# Patient Record
Sex: Female | Born: 1975
Health system: Southern US, Community
[De-identification: ages and names within clinical notes are randomized; demographics above are authoritative.]

## PROBLEM LIST (undated history)

## (undated) ENCOUNTER — Inpatient Hospital Stay (HOSPITAL_COMMUNITY): Payer: Self-pay

## (undated) DIAGNOSIS — I493 Ventricular premature depolarization: Secondary | ICD-10-CM

## (undated) DIAGNOSIS — N200 Calculus of kidney: Secondary | ICD-10-CM

## (undated) DIAGNOSIS — Z9889 Other specified postprocedural states: Secondary | ICD-10-CM

## (undated) DIAGNOSIS — IMO0002 Reserved for concepts with insufficient information to code with codable children: Secondary | ICD-10-CM

## (undated) DIAGNOSIS — K279 Peptic ulcer, site unspecified, unspecified as acute or chronic, without hemorrhage or perforation: Secondary | ICD-10-CM

## (undated) DIAGNOSIS — R011 Cardiac murmur, unspecified: Secondary | ICD-10-CM

## (undated) DIAGNOSIS — Z872 Personal history of diseases of the skin and subcutaneous tissue: Secondary | ICD-10-CM

## (undated) DIAGNOSIS — R87619 Unspecified abnormal cytological findings in specimens from cervix uteri: Secondary | ICD-10-CM

## (undated) DIAGNOSIS — I7300 Raynaud's syndrome without gangrene: Secondary | ICD-10-CM

## (undated) DIAGNOSIS — T7840XA Allergy, unspecified, initial encounter: Secondary | ICD-10-CM

## (undated) HISTORY — PX: WISDOM TOOTH EXTRACTION: SHX21

## (undated) HISTORY — DX: Calculus of kidney: N20.0

## (undated) HISTORY — DX: Reserved for concepts with insufficient information to code with codable children: IMO0002

## (undated) HISTORY — DX: Allergy, unspecified, initial encounter: T78.40XA

## (undated) HISTORY — DX: Personal history of diseases of the skin and subcutaneous tissue: Z87.2

## (undated) HISTORY — DX: Ventricular premature depolarization: I49.3

## (undated) HISTORY — DX: Peptic ulcer, site unspecified, unspecified as acute or chronic, without hemorrhage or perforation: K27.9

## (undated) HISTORY — DX: Other specified postprocedural states: Z98.890

## (undated) HISTORY — DX: Unspecified abnormal cytological findings in specimens from cervix uteri: R87.619

## (undated) HISTORY — PX: GYNECOLOGIC CRYOSURGERY: SHX857

## (undated) HISTORY — PX: APPENDECTOMY: SHX54

## (undated) HISTORY — DX: Cardiac murmur, unspecified: R01.1

---

## 1998-09-05 ENCOUNTER — Emergency Department (HOSPITAL_COMMUNITY): Admission: EM | Admit: 1998-09-05 | Discharge: 1998-09-05 | Payer: Self-pay | Admitting: Emergency Medicine

## 1998-09-06 ENCOUNTER — Encounter: Payer: Self-pay | Admitting: Emergency Medicine

## 2001-03-23 ENCOUNTER — Encounter: Payer: Self-pay | Admitting: Specialist

## 2001-03-23 ENCOUNTER — Ambulatory Visit (HOSPITAL_COMMUNITY): Admission: RE | Admit: 2001-03-23 | Discharge: 2001-03-23 | Payer: Self-pay | Admitting: Specialist

## 2004-08-31 ENCOUNTER — Ambulatory Visit (HOSPITAL_BASED_OUTPATIENT_CLINIC_OR_DEPARTMENT_OTHER): Admission: RE | Admit: 2004-08-31 | Discharge: 2004-08-31 | Payer: Self-pay | Admitting: Family Medicine

## 2004-08-31 ENCOUNTER — Ambulatory Visit: Payer: Self-pay | Admitting: Internal Medicine

## 2011-01-15 LAB — ANTIBODY SCREEN: Antibody Screen: NEGATIVE

## 2011-01-15 LAB — RPR: RPR: NONREACTIVE

## 2011-01-15 LAB — HIV ANTIBODY (ROUTINE TESTING W REFLEX): HIV: NONREACTIVE

## 2011-01-15 LAB — HEPATITIS B SURFACE ANTIGEN: Hepatitis B Surface Ag: NEGATIVE

## 2011-01-15 LAB — ABO/RH: RH Type: POSITIVE

## 2011-01-15 LAB — RUBELLA ANTIBODY, IGM: Rubella: IMMUNE

## 2011-01-15 LAB — GC/CHLAMYDIA PROBE AMP, GENITAL
Chlamydia: NEGATIVE
Gonorrhea: NEGATIVE

## 2011-02-11 ENCOUNTER — Other Ambulatory Visit (HOSPITAL_COMMUNITY): Payer: Self-pay | Admitting: Obstetrics and Gynecology

## 2011-02-12 ENCOUNTER — Other Ambulatory Visit (HOSPITAL_COMMUNITY): Payer: Self-pay | Admitting: Obstetrics and Gynecology

## 2011-02-12 DIAGNOSIS — O280 Abnormal hematological finding on antenatal screening of mother: Secondary | ICD-10-CM

## 2011-03-04 ENCOUNTER — Ambulatory Visit (HOSPITAL_COMMUNITY)
Admission: RE | Admit: 2011-03-04 | Discharge: 2011-03-04 | Disposition: A | Discharge status: Home / Self Care | Payer: 59 | Source: Ambulatory Visit | Attending: Obstetrics and Gynecology | Admitting: Obstetrics and Gynecology

## 2011-03-04 ENCOUNTER — Encounter (HOSPITAL_COMMUNITY): Payer: Self-pay

## 2011-03-04 VITALS — BP 111/59 | HR 74 | Wt 145.0 lb

## 2011-03-04 DIAGNOSIS — O358XX0 Maternal care for other (suspected) fetal abnormality and damage, not applicable or unspecified: Secondary | ICD-10-CM | POA: Insufficient documentation

## 2011-03-04 DIAGNOSIS — O280 Abnormal hematological finding on antenatal screening of mother: Secondary | ICD-10-CM

## 2011-03-04 DIAGNOSIS — Z1389 Encounter for screening for other disorder: Secondary | ICD-10-CM | POA: Insufficient documentation

## 2011-03-04 DIAGNOSIS — O09519 Supervision of elderly primigravida, unspecified trimester: Secondary | ICD-10-CM | POA: Insufficient documentation

## 2011-03-04 DIAGNOSIS — Z363 Encounter for antenatal screening for malformations: Secondary | ICD-10-CM | POA: Insufficient documentation

## 2011-03-04 NOTE — Progress Notes (Signed)
Vital signs reviewed; see ultrasound report 

## 2011-03-06 DIAGNOSIS — O09519 Supervision of elderly primigravida, unspecified trimester: Secondary | ICD-10-CM | POA: Insufficient documentation

## 2011-03-06 NOTE — Progress Notes (Signed)
Genetic Counseling  High-Risk Gestation Note  Appointment Date:  03/06/2011 Referred By: Cheryl Pila, MD Date of Birth:  Dec 06, 1975 Partner:  Cheryl Buck    Pregnancy History: G1P0 Estimated Date of Delivery: 08/05/11 Estimated Gestational Age: [redacted]w[redacted]d  I met with Cheryl Buck, and her partner, Cheryl Buck, for genetic counseling regarding a maternal age of 52 and an increased risk for Down syndrome based on First trimester screening through [facility].  They were counseled regarding maternal age and the association with risk for chromosome conditions due to nondisjunction with aging of the ova.   We reviewed chromosomes, nondisjunction, and the associated 1 in 125 risk for fetal aneuploidy related to a maternal age of 20 at [redacted] weeks gestation.  They were counseled that the risk for aneuploidy decreases as gestational age increases, accounting for those pregnancies which spontaneously abort.  We specifically discussed Down syndrome (trisomy 5), trisomies 42 and 31, and sex chromosome aneuploidies (47,XXX and 47,XXY) including the common features and prognoses of each.   We also reviewed Cheryl Buck's First screen result and the associated increase in risk for fetal Down syndrome (1 in 52).  They were counseled regarding other explanations for a screen positive result including normal variation.  In addition, we reviewed the screen adjusted reduction in risk for trisomy 70.  They understand that First trimester screening provides a pregnancy specific risk for Down syndrome, but is not considered to be diagnostic.  They were counseled regarding other available screening and diagnostic options including ultrasound and amniocentesis.  The risks, benefits, and limitations of each of these options were reviewed in detail.  After thoughtful consideration of these options, they elected to proceed with ultrasound, but declined amniocentesis.  A complete detailed ultrasound was performed today.  The  ultrasound report will be sent under separate cover.  They understand that screening tests cannot rule out all birth defects or genetic syndromes.  The patient was advised of this limitation and states she still does not want diagnostic testing at this time.  However, they were counseled that 50-80% of fetuses with Down syndrome and up to 90% of fetuses with trisomies 13 and 18, when well visualized, have detectable anomalies or soft markers by ultrasound.   Cheryl Buck was provided with written information regarding cystic fibrosis (CF) including the carrier frequency and incidence in the Caucasian population, the availability of carrier testing and prenatal diagnosis if indicated.  In addition, we discussed that CF is routinely screened for as part of the [place] newborn screening panel.  She declined testing today.   Both family histories were reviewed and found to be contributory. Cheryl Buck reported that her paternal half-brother has Down syndrome.  We discussed that 95% of cases of Down syndrome are not inherited and are the result of non-disjunction.  Three to 4% of cases of Down syndrome are the result of a translocation involving chromosome #21.  We discussed the option of chromosome analysis to determine if an individual is a carrier of a balanced translocation involving chromosome #21.  If an individual carries a balanced translocation involving chromosome #21, then the chance to have a baby with Down syndrome would be greater than the maternal age-related risk.  Cheryl Buck was offered the option of peripheral blood chromosome analysis and declined.  In addition, Cheryl Buck reported that her nephew has Asperger syndrome.  We discussed that this condition is considered to represent the high end of functioning on the autism spectrum.  We discussed that autism  spectrum disorders are typically multifactorial in etiology, caused by a combination of both genes and environmental factors. However, autism can occur as a  feature of an underlying genetic condition such as fragile X syndrome.  If the relative has idiopathic autism, the risk of recurrence is estimated to be close to that of the general population.  If this relative's autism is syndromic, the risk of recurrence depends upon the inheritance of the condition.  Cheryl Buck agreed to contact us if she learns more about her relative's diagnosis.  Without further information regarding the provided family history, an accurate genetic risk cannot be calculated.   Further genetic counseling is warranted if more information is obtained.  Without further information regarding the provided family history, an accurate genetic risk cannot be calculated. Further genetic counseling is warranted if more information is obtained.  Cheryl Buck denied exposure to environmental toxins or chemical agents. She denied the use of tobacco and street drugs. She reported that she had ~6-8 alcoholic beverages during the first four weeks post LMP.  We reviewed the all or none period and that this exposure is not expected to increase the risk for congenital anomalies.  She denied significant viral illnesses during the course of her pregnancy. Her medical and surgical histories were noncontributory.     I counseled this couple regarding the above risks and available options.  The approximate face-to-face time with the genetic counselor was 35 minutes.    Despina Arias Certified Genetic Counselor 03/06/2011

## 2011-07-21 NOTE — L&D Delivery Note (Signed)
Delivery Note Pt pushed well about 1 hour and at 7:20 PM a healthy female was delivered via Vaginal, Spontaneous Delivery (Presentation: Left Occiput Anterior).  APGAR: 8, 9; weight 8 lb 9.7 oz (3904 g).   Placenta status: Intact, Spontaneous.  Cord: 3 vessels with the following complications: None.  Anesthesia: Epidural  Episiotomy: None Lacerations: 2nd degree Suture Repair: 3.0 vicryl rapide Est. Blood Loss (mL): 400  Mom to postpartum.  Baby to nursery-stable.  Oliver Pila 08/10/2011, 7:46 PM

## 2011-08-04 ENCOUNTER — Encounter (HOSPITAL_COMMUNITY): Payer: Self-pay | Admitting: *Deleted

## 2011-08-04 ENCOUNTER — Telehealth (HOSPITAL_COMMUNITY): Payer: Self-pay | Admitting: *Deleted

## 2011-08-04 NOTE — Telephone Encounter (Signed)
Preadmission screen  

## 2011-08-06 ENCOUNTER — Telehealth (HOSPITAL_COMMUNITY): Payer: Self-pay | Admitting: *Deleted

## 2011-08-06 NOTE — Telephone Encounter (Signed)
Preadmission screen  

## 2011-08-07 ENCOUNTER — Telehealth (HOSPITAL_COMMUNITY): Payer: Self-pay | Admitting: *Deleted

## 2011-08-07 ENCOUNTER — Encounter (HOSPITAL_COMMUNITY): Payer: Self-pay | Admitting: *Deleted

## 2011-08-07 NOTE — Telephone Encounter (Signed)
Preadmission screen  

## 2011-08-08 ENCOUNTER — Inpatient Hospital Stay (HOSPITAL_COMMUNITY): Payer: 59

## 2011-08-08 ENCOUNTER — Encounter (HOSPITAL_COMMUNITY): Payer: Self-pay

## 2011-08-08 ENCOUNTER — Inpatient Hospital Stay (HOSPITAL_COMMUNITY)
Admission: AD | Admit: 2011-08-08 | Discharge: 2011-08-08 | Disposition: A | Discharge status: Home / Self Care | Payer: 59 | Source: Ambulatory Visit | Attending: Obstetrics and Gynecology | Admitting: Obstetrics and Gynecology

## 2011-08-08 DIAGNOSIS — N1330 Unspecified hydronephrosis: Secondary | ICD-10-CM | POA: Insufficient documentation

## 2011-08-08 DIAGNOSIS — R109 Unspecified abdominal pain: Secondary | ICD-10-CM | POA: Insufficient documentation

## 2011-08-08 LAB — URINALYSIS, ROUTINE W REFLEX MICROSCOPIC
Bilirubin Urine: NEGATIVE
Bilirubin Urine: NEGATIVE
Glucose, UA: NEGATIVE mg/dL
Ketones, ur: NEGATIVE mg/dL
Nitrite: NEGATIVE
Protein, ur: 100 mg/dL — AB
Urobilinogen, UA: 0.2 mg/dL (ref 0.0–1.0)
pH: 6 (ref 5.0–8.0)

## 2011-08-08 LAB — URINE MICROSCOPIC-ADD ON

## 2011-08-08 LAB — URINE CULTURE
Colony Count: >=100000
Culture  Setup Time: 201301191357

## 2011-08-08 MED ORDER — LACTATED RINGERS IV SOLN
INTRAVENOUS | Status: DC
  Administered 2011-08-08: 10:00:00 via INTRAVENOUS

## 2011-08-08 MED ORDER — OXYCODONE-ACETAMINOPHEN 5-325 MG PO TABS: 1.0000 | ORAL_TABLET | ORAL | Status: AC | PRN

## 2011-08-08 MED ORDER — BUTORPHANOL TARTRATE 2 MG/ML IJ SOLN
1.0000 mg | Freq: Once | INTRAMUSCULAR | Status: AC
  Administered 2011-08-08: 2 mg via INTRAMUSCULAR
  Filled 2011-08-08: qty 1

## 2011-08-08 MED ORDER — PHENAZOPYRIDINE HCL 100 MG PO TABS
200.0000 mg | ORAL_TABLET | Freq: Once | ORAL | Status: AC
  Administered 2011-08-08: 200 mg via ORAL
  Filled 2011-08-08: qty 2

## 2011-08-08 MED ORDER — DEXTROSE 5 % IV SOLN
1.0000 g | INTRAVENOUS | Status: DC
  Administered 2011-08-08: 1 g via INTRAVENOUS
  Filled 2011-08-08: qty 10

## 2011-08-08 MED ORDER — ONDANSETRON 4 MG PO TBDP
4.0000 mg | ORAL_TABLET | Freq: Once | ORAL | Status: DC
  Filled 2011-08-08: qty 1

## 2011-08-08 MED ORDER — HYDROMORPHONE HCL PF 1 MG/ML IJ SOLN
1.0000 mg | Freq: Once | INTRAMUSCULAR | Status: AC
  Administered 2011-08-08: 1 mg via INTRAVENOUS
  Filled 2011-08-08: qty 1

## 2011-08-08 MED ORDER — CEPHALEXIN 500 MG PO CAPS: 500.0000 mg | ORAL_CAPSULE | Freq: Four times a day (QID) | ORAL | Status: AC

## 2011-08-08 NOTE — ED Provider Notes (Signed)
History     Chief Complaint  Patient presents with  . Flank Pain  . Abdominal Pain   Flank Pain This is a new problem. The current episode started today. The problem occurs constantly. The problem has been gradually worsening since onset. Pain location: Right groin. The quality of the pain is described as aching. The pain does not radiate. The pain is at a severity of 10/10. The pain is severe. The pain is the same all the time. The symptoms are aggravated by lying down and twisting. Associated symptoms include abdominal pain.  Abdominal Pain    Pertinent Gynecological History:  Bleeding: {uterine bleeding:321  Past Medical History  Diagnosis Date  . Abnormal Pap smear     Past Surgical History  Procedure Date  . Gynecologic cryosurgery   . Appendectomy     Family History  Problem Relation Age of Onset  . Hypertension Father   . Asthma Father   . Diabetes Brother     type1  . Mental retardation Brother     half brother down's  . Diabetes Maternal Aunt     type2  . Diabetes Maternal Grandfather     type2  . Cancer Paternal Grandmother     colon and pituitary tumor  . Asthma Sister     History  Substance Use Topics  . Smoking status: Never Smoker   . Smokeless tobacco: Never Used  . Alcohol Use: No    Allergies:  Allergies  Allergen Reactions  . Benadryl (Diphenhydramine Hcl)     Races heart has passed out from it  . Coconut Fatty Acids Swelling  . Pineapple Swelling    Prescriptions prior to admission  Medication Sig Dispense Refill  . Prenatal Vit-Fe Fumarate-FA (PRENATAL MULTIVITAMIN) TABS Take 1 tablet by mouth daily.      . ranitidine (ZANTAC) 150 MG tablet Take 150 mg by mouth 2 (two) times daily.        Review of Systems  Gastrointestinal: Positive for abdominal pain.  Genitourinary: Positive for flank pain.   Physical Exam   Blood pressure 125/65, pulse 65, temperature 98.3 F (36.8 C), temperature source Oral, resp. rate 18, SpO2  98.00%.  Physical Exam Pt is in distress from right groin pain There is right flank tenderness The abdomen is soft and not tender The cervix is closed and 60 % effaced The vertex is at - 3 station  MAU Course  Procedures Cath urine TNTC WBC'S 11-20- WBC's Many bacteria. [place] moderate hydronephrosis on the right none on the left Ureteral jets on the left none on the right.  MDM   Assessment and Plan  I spoke to Dr. Laverle Patter and he advised no more studies She should be treated with antibiotics and pain meds and be watched for fever.If she develops a fever she should return for further evaluation. Keflex 500 mg q 6h  Percocet 5/325 # 10 1 po q 4h prn pain Pt received 1 gram of rocephin in the MAU Urine has been sent for culture. She has been given 1 mg stadok and 1 mg dilaudid in the MAU  Cheryl Buck F 08/08/2011, 9:49 AM

## 2011-08-08 NOTE — Progress Notes (Signed)
Patient is here with c/o sudden onset of right flank pain and increase frequency in urination. She denies any vaginal bleeding, lof or discharge. She reports good fetal movement.

## 2011-08-08 NOTE — Progress Notes (Signed)
Dr Ambrose Mantle notified of patient and her complaints including ua results. Order to give patient po water and obtain cath UA and call with results.

## 2011-08-08 NOTE — ED Notes (Signed)
Patient given strainer to strain urine at home.

## 2011-08-09 ENCOUNTER — Inpatient Hospital Stay (HOSPITAL_COMMUNITY)
Admission: RE | Admit: 2011-08-09 | Discharge: 2011-08-12 | DRG: 775 | Disposition: A | Discharge status: Home / Self Care | Payer: 59 | Source: Ambulatory Visit | Attending: Obstetrics and Gynecology | Admitting: Obstetrics and Gynecology

## 2011-08-09 ENCOUNTER — Other Ambulatory Visit: Payer: Self-pay | Admitting: Obstetrics and Gynecology

## 2011-08-09 ENCOUNTER — Encounter (HOSPITAL_COMMUNITY): Payer: Self-pay | Admitting: Pharmacist

## 2011-08-09 ENCOUNTER — Encounter (HOSPITAL_COMMUNITY): Payer: Self-pay

## 2011-08-09 DIAGNOSIS — O480 Post-term pregnancy: Principal | ICD-10-CM | POA: Diagnosis present

## 2011-08-09 DIAGNOSIS — O09519 Supervision of elderly primigravida, unspecified trimester: Secondary | ICD-10-CM | POA: Diagnosis present

## 2011-08-09 HISTORY — DX: Raynaud's syndrome without gangrene: I73.00

## 2011-08-09 LAB — CBC
HCT: 40.9 % (ref 36.0–46.0)
Hemoglobin: 13.7 g/dL (ref 12.0–15.0)
WBC: 14.3 10*3/uL — ABNORMAL HIGH (ref 4.0–10.5)

## 2011-08-09 MED ORDER — ONDANSETRON HCL 4 MG/2ML IJ SOLN
4.0000 mg | Freq: Four times a day (QID) | INTRAMUSCULAR | Status: DC | PRN
  Administered 2011-08-10: 4 mg via INTRAVENOUS
  Filled 2011-08-09: qty 2

## 2011-08-09 MED ORDER — LACTATED RINGERS IV SOLN
INTRAVENOUS | Status: DC
  Administered 2011-08-09 – 2011-08-10 (×3): via INTRAVENOUS

## 2011-08-09 MED ORDER — FLEET ENEMA 7-19 GM/118ML RE ENEM: 1.0000 | ENEMA | RECTAL | Status: DC | PRN

## 2011-08-09 MED ORDER — OXYTOCIN BOLUS FROM INFUSION
500.0000 mL | Freq: Once | INTRAVENOUS | Status: AC
  Administered 2011-08-10: 500 mL via INTRAVENOUS
  Filled 2011-08-09: qty 500

## 2011-08-09 MED ORDER — LACTATED RINGERS IV SOLN
500.0000 mL | INTRAVENOUS | Status: DC | PRN
  Administered 2011-08-10: 500 mL via INTRAVENOUS

## 2011-08-09 MED ORDER — CEPHALEXIN 500 MG PO CAPS
500.0000 mg | ORAL_CAPSULE | Freq: Three times a day (TID) | ORAL | Status: DC
  Administered 2011-08-09 – 2011-08-10 (×3): 500 mg via ORAL
  Filled 2011-08-09 (×6): qty 1

## 2011-08-09 MED ORDER — OXYTOCIN 20 UNITS IN LACTATED RINGERS INFUSION - SIMPLE: 125.0000 mL/h | Freq: Once | INTRAVENOUS | Status: DC

## 2011-08-09 MED ORDER — CITRIC ACID-SODIUM CITRATE 334-500 MG/5ML PO SOLN: 30.0000 mL | ORAL | Status: DC | PRN

## 2011-08-09 MED ORDER — ZOLPIDEM TARTRATE 10 MG PO TABS: 10.0000 mg | ORAL_TABLET | Freq: Every evening | ORAL | Status: DC | PRN

## 2011-08-09 MED ORDER — LIDOCAINE HCL (PF) 1 % IJ SOLN
30.0000 mL | INTRAMUSCULAR | Status: DC | PRN
  Filled 2011-08-09: qty 30

## 2011-08-09 MED ORDER — ACETAMINOPHEN 325 MG PO TABS: 650.0000 mg | ORAL_TABLET | ORAL | Status: DC | PRN

## 2011-08-09 MED ORDER — IBUPROFEN 600 MG PO TABS
600.0000 mg | ORAL_TABLET | Freq: Four times a day (QID) | ORAL | Status: DC | PRN
  Administered 2011-08-10: 600 mg via ORAL
  Filled 2011-08-09: qty 1

## 2011-08-09 MED ORDER — TERBUTALINE SULFATE 1 MG/ML IJ SOLN: 0.2500 mg | Freq: Once | INTRAMUSCULAR | Status: AC | PRN

## 2011-08-09 MED ORDER — BUTORPHANOL TARTRATE 2 MG/ML IJ SOLN
1.0000 mg | INTRAMUSCULAR | Status: DC | PRN
  Administered 2011-08-10 (×2): 1 mg via INTRAVENOUS
  Filled 2011-08-09 (×2): qty 1

## 2011-08-09 MED ORDER — OXYTOCIN 20 UNITS IN LACTATED RINGERS INFUSION - SIMPLE
1.0000 m[IU]/min | INTRAVENOUS | Status: DC
  Administered 2011-08-10: 2 m[IU]/min via INTRAVENOUS
  Filled 2011-08-09: qty 1000

## 2011-08-09 MED ORDER — OXYCODONE-ACETAMINOPHEN 5-325 MG PO TABS: 2.0000 | ORAL_TABLET | ORAL | Status: DC | PRN

## 2011-08-09 MED ORDER — DINOPROSTONE 10 MG VA INST
10.0000 mg | VAGINAL_INSERT | Freq: Once | VAGINAL | Status: AC
  Administered 2011-08-09: 10 mg via VAGINAL
  Filled 2011-08-09: qty 1

## 2011-08-09 NOTE — H&P (Signed)
Cheryl Buck is a 36 y.o. female G1P0 at 82 4/7 weeks (EDD 08/05/11 by LMP C/W 10 weeks [place]) presenting for induction.  Prenatal care un eventful except an elevated DSR of 1:53 by first trimester screen.  Pt declined amnio, but did have a normal level 2 [place].  No other significant issues.  History OB History    Grav Para Term Preterm Abortions TAB SAB Ect Mult Living   1              Past Medical History  Diagnosis Date  . Abnormal Pap smear   . Raynaud's disease    Past Surgical History  Procedure Date  . Gynecologic cryosurgery   . Appendectomy    Family History: family history includes Asthma in her father and sister; Cancer in her paternal grandmother; Diabetes in her brother, maternal aunt, and maternal grandfather; Hypertension in her father; and Mental retardation in her brother. Social History:  reports that she has never smoked. She has never used smokeless tobacco. She reports that she does not drink alcohol or use illicit drugs.  ROS  Dilation: Closed Effacement (%): Thick Station: -3 Exam by:: L.mears,RN Temperature 97.7 F (36.5 C), temperature source Oral, resp. rate 16, height 5\' 8"  (1.727 m), weight 80.74 kg (178 lb). Maternal Exam:  Uterine Assessment: Contraction strength is mild.  Contraction frequency is irregular.   Abdomen: Patient reports no abdominal tenderness. Fetal presentation: vertex  Introitus: Normal vulva. Normal vagina.    Fetal Exam Fetal State Assessment: Category I - tracings are normal.     Physical Exam  Constitutional: She is oriented to person, place, and time. She appears well-developed and well-nourished.  Cardiovascular: Normal rate and regular rhythm.   Respiratory: Effort normal and breath sounds normal.  GI: Soft. Bowel sounds are normal.  Genitourinary: Vagina normal and uterus normal.  Musculoskeletal: Normal range of motion.  Neurological: She is alert and oriented to person, place, and time.  Psychiatric: She has a  normal mood and affect.    Prenatal labs: ABO, Rh: O/Positive/-- (06/28 0000) Antibody: Negative (06/28 0000) Rubella: Immune (06/28 0000) RPR: Nonreactive (06/28 0000)  HBsAg: Negative (06/28 0000)  HIV: Non-reactive (06/28 0000)  GBS: Negative (12/17 0000) Increased DSR 1:52 on first trimester screen AFP WNL One hour GTT 94   Assessment/Plan: Pt for cervical ripening and induction given postdates. Cervix feels scarred, may need foley bulb to open.  Will assess s/p cervidil.   Oliver Pila 08/09/2011, 10:27 PM

## 2011-08-09 NOTE — Progress Notes (Signed)
Dr. Ambrose Mantle notified of patient status, FHR tracing and uc pattern. To continue POC.

## 2011-08-10 ENCOUNTER — Encounter (HOSPITAL_COMMUNITY): Payer: Self-pay

## 2011-08-10 ENCOUNTER — Inpatient Hospital Stay (HOSPITAL_COMMUNITY): Payer: 59 | Admitting: Anesthesiology

## 2011-08-10 ENCOUNTER — Encounter (HOSPITAL_COMMUNITY): Payer: Self-pay | Admitting: Anesthesiology

## 2011-08-10 LAB — RPR: RPR Ser Ql: NONREACTIVE

## 2011-08-10 MED ORDER — BENZOCAINE-MENTHOL 20-0.5 % EX AERO
1.0000 "application " | INHALATION_SPRAY | CUTANEOUS | Status: DC | PRN
  Administered 2011-08-11 (×2): 1 via TOPICAL

## 2011-08-10 MED ORDER — ZOLPIDEM TARTRATE 5 MG PO TABS: 5.0000 mg | ORAL_TABLET | Freq: Every evening | ORAL | Status: DC | PRN

## 2011-08-10 MED ORDER — EPHEDRINE 5 MG/ML INJ: 10.0000 mg | INTRAVENOUS | Status: DC | PRN

## 2011-08-10 MED ORDER — TETANUS-DIPHTH-ACELL PERTUSSIS 5-2.5-18.5 LF-MCG/0.5 IM SUSP
0.5000 mL | Freq: Once | INTRAMUSCULAR | Status: DC
  Filled 2011-08-10: qty 0.5

## 2011-08-10 MED ORDER — ONDANSETRON HCL 4 MG PO TABS: 4.0000 mg | ORAL_TABLET | ORAL | Status: DC | PRN

## 2011-08-10 MED ORDER — SENNOSIDES-DOCUSATE SODIUM 8.6-50 MG PO TABS
2.0000 | ORAL_TABLET | Freq: Every day | ORAL | Status: DC
  Administered 2011-08-10 – 2011-08-11 (×2): 2 via ORAL

## 2011-08-10 MED ORDER — PHENYLEPHRINE 40 MCG/ML (10ML) SYRINGE FOR IV PUSH (FOR BLOOD PRESSURE SUPPORT): 80.0000 ug | PREFILLED_SYRINGE | INTRAVENOUS | Status: DC | PRN

## 2011-08-10 MED ORDER — ONDANSETRON HCL 4 MG/2ML IJ SOLN: 4.0000 mg | INTRAMUSCULAR | Status: DC | PRN

## 2011-08-10 MED ORDER — FENTANYL 2.5 MCG/ML BUPIVACAINE 1/10 % EPIDURAL INFUSION (WH - ANES)
14.0000 mL/h | INTRAMUSCULAR | Status: DC
  Administered 2011-08-10 (×2): 14 mL/h via EPIDURAL
  Filled 2011-08-10 (×3): qty 60

## 2011-08-10 MED ORDER — PHENYLEPHRINE 40 MCG/ML (10ML) SYRINGE FOR IV PUSH (FOR BLOOD PRESSURE SUPPORT)
80.0000 ug | PREFILLED_SYRINGE | INTRAVENOUS | Status: DC | PRN
  Filled 2011-08-10: qty 5

## 2011-08-10 MED ORDER — SODIUM BICARBONATE 8.4 % IV SOLN
INTRAVENOUS | Status: DC | PRN
  Administered 2011-08-10: 4 mL via EPIDURAL

## 2011-08-10 MED ORDER — DIBUCAINE 1 % RE OINT: 1.0000 "application " | TOPICAL_OINTMENT | RECTAL | Status: DC | PRN

## 2011-08-10 MED ORDER — IBUPROFEN 600 MG PO TABS
600.0000 mg | ORAL_TABLET | Freq: Four times a day (QID) | ORAL | Status: DC
  Administered 2011-08-11 – 2011-08-12 (×6): 600 mg via ORAL
  Filled 2011-08-10 (×6): qty 1

## 2011-08-10 MED ORDER — LACTATED RINGERS IV SOLN: 500.0000 mL | Freq: Once | INTRAVENOUS | Status: DC

## 2011-08-10 MED ORDER — OXYCODONE-ACETAMINOPHEN 5-325 MG PO TABS: 1.0000 | ORAL_TABLET | ORAL | Status: DC | PRN

## 2011-08-10 MED ORDER — EPHEDRINE 5 MG/ML INJ
10.0000 mg | INTRAVENOUS | Status: DC | PRN
  Filled 2011-08-10: qty 4

## 2011-08-10 MED ORDER — PRENATAL MULTIVITAMIN CH
1.0000 | ORAL_TABLET | Freq: Every day | ORAL | Status: DC
  Administered 2011-08-11 – 2011-08-12 (×2): 1 via ORAL
  Filled 2011-08-10 (×2): qty 1

## 2011-08-10 MED ORDER — FENTANYL 2.5 MCG/ML BUPIVACAINE 1/10 % EPIDURAL INFUSION (WH - ANES)
INTRAMUSCULAR | Status: DC | PRN
  Administered 2011-08-10: 14 mL/h via EPIDURAL

## 2011-08-10 MED ORDER — CEPHALEXIN 500 MG PO CAPS
500.0000 mg | ORAL_CAPSULE | Freq: Four times a day (QID) | ORAL | Status: DC
  Administered 2011-08-10 – 2011-08-12 (×6): 500 mg via ORAL
  Filled 2011-08-10 (×7): qty 1

## 2011-08-10 MED ORDER — DIPHENHYDRAMINE HCL 25 MG PO CAPS: 25.0000 mg | ORAL_CAPSULE | Freq: Four times a day (QID) | ORAL | Status: DC | PRN

## 2011-08-10 MED ORDER — LANOLIN HYDROUS EX OINT: TOPICAL_OINTMENT | CUTANEOUS | Status: DC | PRN

## 2011-08-10 MED ORDER — SIMETHICONE 80 MG PO CHEW: 80.0000 mg | CHEWABLE_TABLET | ORAL | Status: DC | PRN

## 2011-08-10 MED ORDER — WITCH HAZEL-GLYCERIN EX PADS: 1.0000 "application " | MEDICATED_PAD | CUTANEOUS | Status: DC | PRN

## 2011-08-10 NOTE — Progress Notes (Signed)
   Subjective: Pt comfortable with epidural Objective: BP 113/58  Pulse 71  Temp(Src) 98.3 F (36.8 C) (Oral)  Resp 18  Ht 5\' 8"  (1.727 m)  Wt 80.74 kg (178 lb)  BMI 27.06 kg/m2  SpO2 100%      FHT:  FHR: 150 bpm, variability: moderate,  accelerations:  Present,  decelerations:  Absent UC:   regular, every 2-6 minutes SVE: 4/90/-1  AROM clear IUPC placed  Labs: Lab Results  Component Value Date   WBC 14.3* 08/09/2011   HGB 13.7 08/09/2011   HCT 40.9 08/09/2011   MCV 92.7 08/09/2011   PLT 171 08/09/2011    Assessment / Plan: Augment with pitocin as needed Neenah Canter W 08/10/2011, 9:13 AM

## 2011-08-10 NOTE — Anesthesia Preprocedure Evaluation (Signed)

## 2011-08-10 NOTE — Anesthesia Procedure Notes (Signed)

## 2011-08-10 NOTE — Progress Notes (Signed)
Patient ID: Cheryl Buck, female   DOB: 1976/01/06, 36 y.o.   MRN: [id number] Per the RN as she removed the cervidil the cervix is unchanged and remains undilated.

## 2011-08-10 NOTE — Progress Notes (Signed)
Cheryl Buck is a 36 y.o. G1P0 at [redacted]w[redacted]d by LMP admitted for induction of labor due to Post dates. Due date 08/05/11.  She received cervidil overnight and has been very uncomfortable with contractions for several hours, little relief from stadol.    Subjective: Pt uncomfortable with contractions3  Objective: BP 129/73  Pulse 63  Temp(Src) 98.3 F (36.8 C) (Oral)  Resp 18  Ht 5\' 8"  (1.727 m)  Wt 80.74 kg (178 lb)  BMI 27.06 kg/m2      FHT:  FHR: 130 bpm, variability: moderate,  accelerations:  Present,  decelerations:  Absent UC:   regular, every 2-3 minutes SVE:   Dilation: Closed Effacement (%): 90 Station: -2 Exam by:: Dr Senaida Ores  Labs: Lab Results  Component Value Date   WBC 14.3* 08/09/2011   HGB 13.7 08/09/2011   HCT 40.9 08/09/2011   MCV 92.7 08/09/2011   PLT 171 08/09/2011    Assessment / Plan: Pt is very uncomfortable with contractions.  Cervix still scarred, but feels effaced and will allow epidural so can manually dilate os. Cheryl Buck 08/10/2011, 8:11 AM

## 2011-08-10 NOTE — Progress Notes (Signed)
   Subjective: Comfortable with epidural  Objective: BP 115/66  Pulse 70  Temp(Src) 98.3 F (36.8 C) (Oral)  Resp 20  Ht 5\' 8"  (1.727 m)  Wt 80.74 kg (178 lb)  BMI 27.06 kg/m2  SpO2 99%      FHT:  FHR: 135 bpm, variability: moderate,  accelerations:  Present,  decelerations:  Absent UC:   regular, every 2-3 minutes SVE:   Dilation: 9 Effacement (%): 100 Station: 0 Exam by:: Dr. Senaida Ores  Labs: Lab Results  Component Value Date   WBC 14.3* 08/09/2011   HGB 13.7 08/09/2011   HCT 40.9 08/09/2011   MCV 92.7 08/09/2011   PLT 171 08/09/2011    Assessment / Plan: Making good progress Cheryl Buck W 08/10/2011, 3:17 PM

## 2011-08-10 NOTE — Progress Notes (Signed)
   Subjective: Comfortable with epidural  Objective: BP 113/66  Pulse 72  Temp(Src) 98 F (36.7 C) (Oral)  Resp 18  Ht 5\' 8"  (1.727 m)  Wt 80.74 kg (178 lb)  BMI 27.06 kg/m2  SpO2 99%      FHT:  FHR: 130 bpm, variability: moderate,  accelerations:  Present,  decelerations:  Absent UC:   regular, every 2-3 minutes, not yet quite adequate SVE:   Dilation: 6 Effacement (%): 100 Station: -1 Exam by:: Erline Hau RNC at 100pm  Labs: Lab Results  Component Value Date   WBC 14.3* 08/09/2011   HGB 13.7 08/09/2011   HCT 40.9 08/09/2011   MCV 92.7 08/09/2011   PLT 171 08/09/2011    Assessment / Plan: Making progress.  Cheryl Buck 08/10/2011, 1:39 PM

## 2011-08-10 NOTE — Progress Notes (Signed)
This note also relates to the following rows which could not be included: BP - Cannot attach notes to unvalidated device data Pulse Rate - Cannot attach notes to unvalidated device data Dr. Ambrose Mantle at bedside. Notified of sve, fhr tracing and uc pattern, and patients pain. Cervidil removed and pitocin to be held until Dr. Senaida Ores assesses patient later today and reviews POC.

## 2011-08-11 LAB — CBC
MCH: 31.1 pg (ref 26.0–34.0)
MCHC: 33.6 g/dL (ref 30.0–36.0)
MCV: 92.6 fL (ref 78.0–100.0)
Platelets: 145 10*3/uL — ABNORMAL LOW (ref 150–400)
RDW: 13.9 % (ref 11.5–15.5)

## 2011-08-11 MED ORDER — BENZOCAINE-MENTHOL 20-0.5 % EX AERO
INHALATION_SPRAY | CUTANEOUS | Status: AC
  Administered 2011-08-11: 1 via TOPICAL
  Filled 2011-08-11: qty 56

## 2011-08-11 NOTE — Anesthesia Postprocedure Evaluation (Signed)
  Anesthesia Post-op Note  Patient: Cheryl Buck  Procedure(s) Performed: * No procedures listed *  Patient Location: Mother/Baby  Anesthesia Type: Epidural  Level of Consciousness: alert  and oriented  Airway and Oxygen Therapy: Patient Spontanous Breathing  Post-op Pain: mild  Post-op Assessment: Patient's Cardiovascular Status Stable and Respiratory Function Stable  Post-op Vital Signs: stable  Complications: No apparent anesthesia complications

## 2011-08-11 NOTE — Progress Notes (Signed)
Post Partum Day 1 Subjective: no complaints and tolerating PO  Objective: Blood pressure 120/73, pulse 73, temperature 98.3 F (36.8 C), temperature source Oral, resp. rate 18, height 5\' 8"  (1.727 m), weight 80.74 kg (178 lb), SpO2 96.00%, unknown if currently breastfeeding.  Physical Exam:  General: alert Lochia: appropriate Uterine Fundus: firm    Basename 08/11/11 0520 08/09/11 2000  HGB 11.8* 13.7  HCT 35.1* 40.9    Assessment/Plan: Plan for discharge tomorrow   LOS: 2 days   Cordarro Spinnato W 08/11/2011, 8:28 AM

## 2011-08-12 MED ORDER — IBUPROFEN 600 MG PO TABS: 600.0000 mg | ORAL_TABLET | Freq: Four times a day (QID) | ORAL | Status: AC

## 2011-08-12 NOTE — Discharge Summary (Signed)
Obstetric Discharge Summary Reason for Admission: induction of labor Prenatal Procedures: none Intrapartum Procedures: spontaneous vaginal delivery Postpartum Procedures: none Complications-Operative and Postpartum: second degree perineal laceration Hemoglobin  Date Value Range Status  08/11/2011 11.8* 12.0-15.0 (g/dL) Final     HCT  Date Value Range Status  08/11/2011 35.1* 36.0-46.0 (%) Final    Discharge Diagnoses: Term Pregnancy-delivered  Discharge Information: Date: 08/12/2011 Activity: pelvic rest Diet: routine Medications: Ibuprofen and Percocet Condition: improved Instructions: refer to practice specific booklet Discharge to: home   Newborn Data: Live born female  Birth Weight: 8 lb 9.7 oz (3904 g) APGAR: 8, 9  Home with mother.  Cheryl Buck 08/12/2011, 9:04 AM

## 2011-08-12 NOTE — Progress Notes (Signed)
Post Partum Day 2 Subjective: no complaints, up ad lib and tolerating PO  Objective: Blood pressure 116/67, pulse 70, temperature 97.8 F (36.6 C), temperature source Oral, resp. rate 18, height 5\' 8"  (1.727 m), weight 80.74 kg (178 lb), SpO2 98.00%, unknown if currently breastfeeding.  Physical Exam:  General: alert Lochia: appropriate Uterine Fundus: firm   Basename 08/11/11 0520 08/09/11 2000  HGB 11.8* 13.7  HCT 35.1* 40.9    Assessment/Plan: Discharge home Motrin   LOS: 3 days   Cheryl Buck W 08/12/2011, 9:02 AM

## 2011-08-17 ENCOUNTER — Encounter (HOSPITAL_COMMUNITY): Payer: 59

## 2011-08-17 ENCOUNTER — Ambulatory Visit (HOSPITAL_COMMUNITY)
Admission: RE | Admit: 2011-08-17 | Discharge: 2011-08-17 | Disposition: A | Discharge status: Home / Self Care | Payer: 59 | Source: Ambulatory Visit | Attending: Obstetrics and Gynecology | Admitting: Obstetrics and Gynecology

## 2011-08-17 NOTE — Progress Notes (Signed)
Adult Lactation Consultation Outpatient Visit Note  Patient Name: Cheryl Buck Date of Birth: Feb 22, 1976 Gestational Age at Delivery: [redacted]w[redacted]d Type of Delivery:   Breastfeeding History: Frequency of Breastfeeding:  Length of Feeding:15 mins   Voids: 8-10 Stools: 5-6 yellow   Supplementing / Method: Pumping:  Type of Pump:medela   Frequency: 3-4 times daily  Volume:  30-45 ml  Comments: Mother here to follow up for weight check and feeding assist. Lots of questions about plugged ductal system.   Consultation Evaluation: Assessed mothers breast and observed that left breast did have lots of full ducts. inst mother in good massage and using football hold to massage breast. Assistance with good massage and resolved fullness. Infant fed very well and transferred 60 ml from first breast. 2nd breast offered and infant took 20 ml . Mother receptive to teaching tips and referred her to follow up prn or attend mothers support group as desired for pre and post weights.  Initial Feeding Assessment: Pre-feed Weight:3630 Post-feed [id number] Amount Transferred:18ml Comments:  Additional Feeding Assessment: Pre-feed Weight:3690 Post-feed Weight:3710 Amount Transferred:51ml Comments:  Additional Feeding Assessment: Pre-feed Weight: Post-feed Weight: Amount Transferred: Comments:  Total Breast milk Transferred this Visit: 80ml Total Supplement Given:   Additional Interventions: Mother encouraged to continue to cue base feed infant and to pump several times daily to sustain milk volume.  Mother very encouraged about good weight gain and adequate feeding. Mother plans to follow up as needed.  Follow-Up  Prn    Stevan Born [facility] 08/17/2011, 4:35 PM

## 2012-02-05 ENCOUNTER — Emergency Department (HOSPITAL_COMMUNITY)
Admission: EM | Admit: 2012-02-05 | Discharge: 2012-02-05 | Disposition: A | Discharge status: Home / Self Care | Payer: 59 | Source: Home / Self Care | Attending: Emergency Medicine | Admitting: Emergency Medicine

## 2012-02-05 DIAGNOSIS — J40 Bronchitis, not specified as acute or chronic: Secondary | ICD-10-CM

## 2012-02-05 MED ORDER — GUAIFENESIN-CODEINE 100-10 MG/5ML PO SYRP: 5.0000 mL | ORAL_SOLUTION | Freq: Three times a day (TID) | ORAL | Status: AC | PRN

## 2012-02-05 MED ORDER — AMOXICILLIN 500 MG PO CAPS: 500.0000 mg | ORAL_CAPSULE | Freq: Three times a day (TID) | ORAL | Status: AC

## 2012-02-05 MED ORDER — DOXYCYCLINE HYCLATE 100 MG PO CAPS: 100.0000 mg | ORAL_CAPSULE | Freq: Two times a day (BID) | ORAL | Status: DC

## 2012-02-05 NOTE — ED Provider Notes (Addendum)
History     CSN: [id number]  Arrival date & time 02/05/12  1237   First MD Initiated Contact with Patient 02/05/12 1323      No chief complaint on file.   (Consider location/radiation/quality/duration/timing/severity/associated sxs/prior treatment) HPI Comments: Patient has been coughing since Thursday with fatigue and tiredness lack of appetite low energy had initial tactile fevers for the first 2 days that has subsided for cough seemed to have prolonged until today and not letting her sleep occasionally dry and with some productive sputum. She feels tired and fatigue and poor appetite. She has been taken over-the-counter medicines as cough suppressants. Patient denies any shortness of breath at rest or exertional, no chest pain, describes a sore throat it has increased in the last 2-3 days. Denies any ear pain, headache.   The history is provided by the patient.    Past Medical History  Diagnosis Date  . Abnormal Pap smear   . Raynaud's disease     Past Surgical History  Procedure Date  . Gynecologic cryosurgery   . Appendectomy     Family History  Problem Relation Age of Onset  . Hypertension Father   . Asthma Father   . Diabetes Brother     type1  . Mental retardation Brother     half brother down's  . Diabetes Maternal Aunt     type2  . Diabetes Maternal Grandfather     type2  . Cancer Paternal Grandmother     colon and pituitary tumor  . Asthma Sister     History  Substance Use Topics  . Smoking status: Never Smoker   . Smokeless tobacco: Never Used  . Alcohol Use: No    OB History    Grav Para Term Preterm Abortions TAB SAB Ect Mult Living   1 1 1       1       Review of Systems  Constitutional: Positive for fever, activity change, appetite change and fatigue.  HENT: Negative for neck pain and neck stiffness.   Eyes: Negative for redness and visual disturbance.  Respiratory: Positive for cough. Negative for chest tightness and wheezing.     Cardiovascular: Negative for chest pain, palpitations and leg swelling.  Gastrointestinal: Negative for nausea and abdominal pain.  Skin: Negative for rash.  Neurological: Negative for weakness and numbness.    Allergies  Benadryl; Coconut fatty acids; and Pineapple  Home Medications   Current Outpatient Rx  Name Route Sig Dispense Refill  . AMOXICILLIN 500 MG PO CAPS Oral Take 1 capsule (500 mg total) by mouth 3 (three) times daily. 30 capsule 0  . GUAIFENESIN-CODEINE 100-10 MG/5ML PO SYRP Oral Take 5 mLs by mouth 3 (three) times daily as needed for cough or congestion. 120 mL 0  . PRENATAL MULTIVITAMIN CH Oral Take 1 tablet by mouth daily.      BP 110/73  Pulse 68  Temp 97.4 F (36.3 C) (Oral)  Resp 18  SpO2 100%  Breastfeeding? Yes  Physical Exam  Nursing note and vitals reviewed. Constitutional: She is oriented to person, place, and time. Vital signs are normal. She appears well-developed and well-nourished.  Non-toxic appearance. She does not have a sickly appearance. She does not appear ill. No distress.  HENT:  Head: Normocephalic.  Right Ear: Tympanic membrane normal.  Mouth/Throat: Uvula is midline, oropharynx is clear and moist and mucous membranes are normal.  Eyes: Conjunctivae are normal. No scleral icterus.  Neck: Neck supple.  Cardiovascular: Normal rate.  Pulmonary/Chest: Effort normal and breath sounds normal. No respiratory distress. She has no decreased breath sounds. She has no wheezes. She has no rhonchi. She has no rales. She exhibits no tenderness.  Abdominal: She exhibits no distension. There is no guarding.  Musculoskeletal: Normal range of motion.  Neurological: She is alert and oriented to person, place, and time.  Skin: No rash noted. No erythema.    ED Course  Procedures (including critical care time)   Labs Reviewed  POCT RAPID STREP A ([facility] ONLY)   No results found.   1. Bronchitis       MDM  Patient has been  symptomatic for 8 days. Now with productive cough somewhat fatigued. Patient can very well have a viral respiratory syndrome but symptoms are not improving and in fact patient feels she's getting worse. Have decided to start patient on antibiotics (amoxicillin) and treat with cough syrup with codeine and guanfacine. She was advised to return in 3 days if no improvement or sooner if any worsening symptoms.        Jimmie Molly, MD 02/05/12 1511  Jimmie Molly, MD 02/05/12 2112

## 2012-03-29 DIAGNOSIS — D229 Melanocytic nevi, unspecified: Secondary | ICD-10-CM

## 2012-03-29 HISTORY — DX: Melanocytic nevi, unspecified: D22.9

## 2012-06-24 ENCOUNTER — Ambulatory Visit (INDEPENDENT_AMBULATORY_CARE_PROVIDER_SITE_OTHER): Payer: Commercial Managed Care - PPO | Admitting: General Surgery

## 2012-06-24 ENCOUNTER — Encounter (INDEPENDENT_AMBULATORY_CARE_PROVIDER_SITE_OTHER): Payer: Self-pay | Admitting: General Surgery

## 2012-06-24 VITALS — BP 126/62 | HR 62 | Temp 97.8°F | Resp 18 | Ht 69.0 in | Wt 135.6 lb

## 2012-06-24 DIAGNOSIS — R2230 Localized swelling, mass and lump, unspecified upper limb: Secondary | ICD-10-CM

## 2012-06-24 DIAGNOSIS — R229 Localized swelling, mass and lump, unspecified: Secondary | ICD-10-CM

## 2012-06-26 NOTE — Progress Notes (Signed)
Patient ID: Cheryl Buck, female   DOB: 07/15/1976, 36 y.o.   MRN: [id number]  Chief Complaint  Patient presents with  . New Evaluation    Lymph    HPI Cheryl Buck is a 36 y.o. female.   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  She had mass show up in lue over a week ago.  She was seen by her pcp and was given a course of antibiotics. This area has gotten somewhat smaller and has significantly over last 24 hours.  She reports no B symptoms.    Past Medical History  Diagnosis Date  . Abnormal Pap smear   . Raynaud's disease     Past Surgical History  Procedure Date  . Gynecologic cryosurgery   . Appendectomy     Family History  Problem Relation Age of Onset  . Hypertension Father   . Asthma Father   . Diabetes Brother     type1  . Mental retardation Brother     half brother down's  . Hypertension Brother   . Diabetes Maternal Aunt     type2  . Diabetes Maternal Grandfather     type2  . Heart disease Maternal Grandfather   . Cancer Paternal Grandmother     colon and pituitary tumor  . Asthma Sister   . Hyperlipidemia Mother   . GER disease Mother   . Heart disease Maternal Grandmother   . Stroke Paternal Grandfather     Social History History  Substance Use Topics  . Smoking status: Never Smoker   . Smokeless tobacco: Never Used  . Alcohol Use: No    Allergies  Allergen Reactions  . Benadryl (Diphenhydramine Hcl)     Races heart has passed out from it  . Coconut Fatty Acids Swelling  . Pineapple Swelling    Current Outpatient Prescriptions  Medication Sig Dispense Refill  . Prenatal Vit-Fe Fumarate-FA (PRENATAL MULTIVITAMIN) TABS Take 1 tablet by mouth daily.        Review of Systems Review of Systems  Constitutional: Negative for fever, chills and unexpected weight change.  HENT: Negative for hearing loss, congestion, sore throat, trouble swallowing and voice change.   Eyes: Negative for visual disturbance.    Respiratory: Negative for cough and wheezing.   Cardiovascular: Negative for chest pain, palpitations and leg swelling.  Gastrointestinal: Negative for nausea, vomiting, abdominal pain, diarrhea, constipation, blood in stool, abdominal distention and anal bleeding.  Genitourinary: Negative for hematuria, vaginal bleeding and difficulty urinating.  Musculoskeletal: Negative for arthralgias.  Skin: Negative for rash and wound.  Neurological: Negative for seizures, syncope and headaches.  Hematological: Negative for adenopathy. Does not bruise/bleed easily.  Psychiatric/Behavioral: Negative for confusion.    Blood pressure 126/62, pulse 62, temperature 97.8 F (36.6 C), temperature source Temporal, resp. rate 18, height 5' 9" (1.753 m), weight 135 lb 9.6 oz (61.508 kg).  Physical Exam Physical Exam  Vitals reviewed. Constitutional: She appears well-developed and well-nourished.  Musculoskeletal:       Arms:     Assessment    LUE mass    Plan    She has no constitutional symptoms.  I examined this yesterday in the or and it is smaller today by both her history and my exam.  I think observing this a little longer is warranted prior to excising. We could also get an ultrasound as we discussed today.  She will let me know early next week how   this is doing and we will decide.       Tonjia Parillo 06/26/2012, 9:22 AM    

## 2012-06-27 ENCOUNTER — Other Ambulatory Visit (INDEPENDENT_AMBULATORY_CARE_PROVIDER_SITE_OTHER): Payer: Self-pay | Admitting: General Surgery

## 2012-06-27 ENCOUNTER — Telehealth (INDEPENDENT_AMBULATORY_CARE_PROVIDER_SITE_OTHER): Payer: Self-pay | Admitting: General Surgery

## 2012-06-27 DIAGNOSIS — R2230 Localized swelling, mass and lump, unspecified upper limb: Secondary | ICD-10-CM

## 2012-06-27 NOTE — Telephone Encounter (Signed)
Patient scheduled 06/28/12 @ 12:45 pm for ultrasound right arm mass at [facility]. [facility] for patient to call back to make her aware. Patient called back and she was made aware of appt.

## 2012-06-28 ENCOUNTER — Ambulatory Visit (HOSPITAL_COMMUNITY)
Admission: RE | Admit: 2012-06-28 | Discharge: 2012-06-28 | Disposition: A | Discharge status: Home / Self Care | Payer: 59 | Source: Ambulatory Visit | Attending: General Surgery | Admitting: General Surgery

## 2012-06-28 DIAGNOSIS — R2230 Localized swelling, mass and lump, unspecified upper limb: Secondary | ICD-10-CM

## 2012-06-28 DIAGNOSIS — R229 Localized swelling, mass and lump, unspecified: Secondary | ICD-10-CM | POA: Insufficient documentation

## 2012-06-29 ENCOUNTER — Other Ambulatory Visit (INDEPENDENT_AMBULATORY_CARE_PROVIDER_SITE_OTHER): Payer: Self-pay | Admitting: General Surgery

## 2012-06-30 ENCOUNTER — Encounter (HOSPITAL_BASED_OUTPATIENT_CLINIC_OR_DEPARTMENT_OTHER)
Admission: RE | Disposition: A | Discharge status: Home / Self Care | Payer: Self-pay | Source: Ambulatory Visit | Attending: General Surgery

## 2012-06-30 ENCOUNTER — Ambulatory Visit (HOSPITAL_BASED_OUTPATIENT_CLINIC_OR_DEPARTMENT_OTHER)
Admission: RE | Admit: 2012-06-30 | Discharge: 2012-06-30 | Disposition: A | Discharge status: Home / Self Care | Payer: 59 | Source: Ambulatory Visit | Attending: General Surgery | Admitting: General Surgery

## 2012-06-30 DIAGNOSIS — Z9089 Acquired absence of other organs: Secondary | ICD-10-CM | POA: Insufficient documentation

## 2012-06-30 DIAGNOSIS — Z833 Family history of diabetes mellitus: Secondary | ICD-10-CM | POA: Insufficient documentation

## 2012-06-30 DIAGNOSIS — Z800 Family history of malignant neoplasm of digestive organs: Secondary | ICD-10-CM | POA: Insufficient documentation

## 2012-06-30 DIAGNOSIS — Z810 Family history of intellectual disabilities: Secondary | ICD-10-CM | POA: Insufficient documentation

## 2012-06-30 DIAGNOSIS — D1739 Benign lipomatous neoplasm of skin and subcutaneous tissue of other sites: Secondary | ICD-10-CM | POA: Insufficient documentation

## 2012-06-30 DIAGNOSIS — Z8249 Family history of ischemic heart disease and other diseases of the circulatory system: Secondary | ICD-10-CM | POA: Insufficient documentation

## 2012-06-30 DIAGNOSIS — Z825 Family history of asthma and other chronic lower respiratory diseases: Secondary | ICD-10-CM | POA: Insufficient documentation

## 2012-06-30 DIAGNOSIS — I7300 Raynaud's syndrome without gangrene: Secondary | ICD-10-CM | POA: Insufficient documentation

## 2012-06-30 DIAGNOSIS — Z823 Family history of stroke: Secondary | ICD-10-CM | POA: Insufficient documentation

## 2012-06-30 HISTORY — PX: MASS EXCISION: SHX2000

## 2012-06-30 SURGERY — MINOR EXCISION OF MASS
Anesthesia: LOCAL | Site: Arm Upper | Laterality: Left | Wound class: Clean

## 2012-06-30 MED ORDER — BUPIVACAINE HCL (PF) 0.25 % IJ SOLN
INTRAMUSCULAR | Status: DC | PRN
  Administered 2012-06-30: 5 mL

## 2012-06-30 MED ORDER — LIDOCAINE-EPINEPHRINE (PF) 1 %-1:200000 IJ SOLN
INTRAMUSCULAR | Status: DC | PRN
  Administered 2012-06-30: 5 mL

## 2012-06-30 SURGICAL SUPPLY — 23 items
BLADE SURG 15 STRL LF DISP TIS (BLADE) ×1 IMPLANT
BLADE SURG 15 STRL SS (BLADE) ×1
CHLORAPREP W/TINT 26ML (MISCELLANEOUS) ×2 IMPLANT
CLOTH BEACON ORANGE TIMEOUT ST (SAFETY) ×2 IMPLANT
DERMABOND ADVANCED (GAUZE/BANDAGES/DRESSINGS) ×1
DERMABOND ADVANCED .7 DNX12 (GAUZE/BANDAGES/DRESSINGS) ×1 IMPLANT
DRAPE PED LAPAROTOMY (DRAPES) ×2 IMPLANT
ELECT COATED BLADE 2.86 ST (ELECTRODE) ×2 IMPLANT
ELECT REM PT RETURN 9FT ADLT (ELECTROSURGICAL) ×2
ELECTRODE REM PT RTRN 9FT ADLT (ELECTROSURGICAL) ×1 IMPLANT
GLOVE BIO SURGEON STRL SZ7 (GLOVE) ×2 IMPLANT
GLOVE BIOGEL PI IND STRL 7.0 (GLOVE) ×1 IMPLANT
GLOVE BIOGEL PI INDICATOR 7.0 (GLOVE) ×1
GLOVE ECLIPSE 6.5 STRL STRAW (GLOVE) ×2 IMPLANT
GOWN STRL NON-REIN LRG LVL3 (GOWN DISPOSABLE) ×4 IMPLANT
NEEDLE HYPO 25X1 1.5 SAFETY (NEEDLE) ×2 IMPLANT
PENCIL BUTTON HOLSTER BLD 10FT (ELECTRODE) ×2 IMPLANT
STRIP CLOSURE SKIN 1/2X4 (GAUZE/BANDAGES/DRESSINGS) ×2 IMPLANT
SUT MON AB 4-0 PC3 18 (SUTURE) ×2 IMPLANT
SUT VIC AB 3-0 SH 27 (SUTURE) ×1
SUT VIC AB 3-0 SH 27X BRD (SUTURE) ×1 IMPLANT
SYR CONTROL 10ML LL (SYRINGE) ×2 IMPLANT
TOWEL OR 17X24 6PK STRL BLUE (TOWEL DISPOSABLE) ×2 IMPLANT

## 2012-06-30 NOTE — Op Note (Signed)
Preoperative diagnosis: Left upper extremity mass Postoperative diagnosis: SAA Procedure: Excision of 5 mm subcutaneous mass Surgeon: Dr Harden Mo Anesthesia: local Specimen: left arm mass EBL: minimal Complications: none Sponge and needle count correct  Disposition to recovery   Indications: This is a 66 yof who has left upper Or masses been present for about 3 weeks. She did treat with antibiotics this areas not improve. We discussed all of her options after she data ultrasound showed appear to be a subcutaneous mass possibly a lipoma and she would like to have this excised.  Procedure: After informed consent was obtained the patient to the operative. She was prepped and draped in the standard sterile surgical fashion presurgical timeout and performed.  We both identified this area prior to beginning. I infiltrated a mixture of quarter percent Marcaine and 1% lidocaine. I then made an incision. I removed what ended up being a very small 5 mm subcutaneous mass. There were no other abnormalities noted. I then passed this off the table as a specimen. I then closed 3-0 Vicryl, 4 Monocryl, and Dermabond and Steri-Strips. She tolerated this well was transferred to recovery stable.

## 2012-06-30 NOTE — Interval H&P Note (Signed)
History and Physical Interval Note:  06/30/2012 1:08 PM  Cheryl Buck  has presented today for surgery, with the diagnosis of left arm mass  The various methods of treatment have been discussed with the patient and family. After consideration of risks, benefits and other options for treatment, the patient has consented to  Procedure(s) (LRB) with comments: MINOR EXCISION OF MASS (Left) as a surgical intervention .  The patient's history has been reviewed, patient examined, no change in status, stable for surgery.  I have reviewed the patient's chart and labs.  Questions were answered to the patient's satisfaction.     Anicia Leuthold

## 2012-06-30 NOTE — H&P (View-Only) (Signed)
Patient ID: Cheryl Buck, female   DOB: 1975-10-24, 36 y.o.   MRN: [id number]  Chief Complaint  Patient presents with  . New Evaluation    Lymph    HPI Cheryl Buck is a 36 y.o. female.   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  She had mass show up in lue over a week ago.  She was seen by her pcp and was given a course of antibiotics. This area has gotten somewhat smaller and has significantly over last 24 hours.  She reports no B symptoms.    Past Medical History  Diagnosis Date  . Abnormal Pap smear   . Raynaud's disease     Past Surgical History  Procedure Date  . Gynecologic cryosurgery   . Appendectomy     Family History  Problem Relation Age of Onset  . Hypertension Father   . Asthma Father   . Diabetes Brother     type1  . Mental retardation Brother     half brother down's  . Hypertension Brother   . Diabetes Maternal Aunt     type2  . Diabetes Maternal Grandfather     type2  . Heart disease Maternal Grandfather   . Cancer Paternal Grandmother     colon and pituitary tumor  . Asthma Sister   . Hyperlipidemia Mother   . GER disease Mother   . Heart disease Maternal Grandmother   . Stroke Paternal Grandfather     Social History History  Substance Use Topics  . Smoking status: Never Smoker   . Smokeless tobacco: Never Used  . Alcohol Use: No    Allergies  Allergen Reactions  . Benadryl (Diphenhydramine Hcl)     Races heart has passed out from it  . Coconut Fatty Acids Swelling  . Pineapple Swelling    Current Outpatient Prescriptions  Medication Sig Dispense Refill  . Prenatal Vit-Fe Fumarate-FA (PRENATAL MULTIVITAMIN) TABS Take 1 tablet by mouth daily.        Review of Systems Review of Systems  Constitutional: Negative for fever, chills and unexpected weight change.  HENT: Negative for hearing loss, congestion, sore throat, trouble swallowing and voice change.   Eyes: Negative for visual disturbance.    Respiratory: Negative for cough and wheezing.   Cardiovascular: Negative for chest pain, palpitations and leg swelling.  Gastrointestinal: Negative for nausea, vomiting, abdominal pain, diarrhea, constipation, blood in stool, abdominal distention and anal bleeding.  Genitourinary: Negative for hematuria, vaginal bleeding and difficulty urinating.  Musculoskeletal: Negative for arthralgias.  Skin: Negative for rash and wound.  Neurological: Negative for seizures, syncope and headaches.  Hematological: Negative for adenopathy. Does not bruise/bleed easily.  Psychiatric/Behavioral: Negative for confusion.    Blood pressure 126/62, pulse 62, temperature 97.8 F (36.6 C), temperature source Temporal, resp. rate 18, height 5\' 9"  (1.753 m), weight 135 lb 9.6 oz (61.508 kg).  Physical Exam Physical Exam  Vitals reviewed. Constitutional: She appears well-developed and well-nourished.  Musculoskeletal:       Arms:     Assessment    LUE mass    Plan    She has no constitutional symptoms.  I examined this yesterday in the or and it is smaller today by both her history and my exam.  I think observing this a little longer is warranted prior to excising. We could also get an ultrasound as we discussed today.  She will let me know early next week how  this is doing and we will decide.       Anna Livers 06/26/2012, 9:22 AM

## 2012-07-01 ENCOUNTER — Encounter (HOSPITAL_BASED_OUTPATIENT_CLINIC_OR_DEPARTMENT_OTHER): Payer: Self-pay | Admitting: General Surgery

## 2012-07-20 NOTE — L&D Delivery Note (Signed)
Delivery Note Pt reached complete dilation and pushed great.  At 8:40 PM a healthy female was delivered via Vaginal, Spontaneous Delivery (Presentation: Right Occiput Posterior).  APGAR: 9, 9; weight pending .   Placenta status: Intact, Spontaneous.  Cord: 3 vessels with the following complications: None.    Anesthesia: Epidural  Episiotomy: None Lacerations: 2nd degree;Perineal Suture Repair: 2.0 vicryl rapide Est. Blood Loss (mL): 350cc  Mom to postpartum.  Baby to stay with mom.  Oliver Pila 04/05/2013, 9:12 PM

## 2012-12-27 LAB — OB RESULTS CONSOLE HIV ANTIBODY (ROUTINE TESTING): HIV: NONREACTIVE

## 2013-02-09 ENCOUNTER — Emergency Department (HOSPITAL_COMMUNITY)
Admission: EM | Admit: 2013-02-09 | Discharge: 2013-02-09 | Disposition: A | Discharge status: Home / Self Care | Payer: 59 | Source: Home / Self Care | Attending: Family Medicine | Admitting: Family Medicine

## 2013-02-09 ENCOUNTER — Encounter (HOSPITAL_COMMUNITY): Payer: Self-pay | Admitting: Emergency Medicine

## 2013-02-09 DIAGNOSIS — Z331 Pregnant state, incidental: Secondary | ICD-10-CM

## 2013-02-09 DIAGNOSIS — N390 Urinary tract infection, site not specified: Secondary | ICD-10-CM

## 2013-02-09 LAB — POCT URINALYSIS DIP (DEVICE)
Bilirubin Urine: NEGATIVE
Nitrite: POSITIVE — AB
pH: 7 (ref 5.0–8.0)

## 2013-02-09 LAB — GLUCOSE, CAPILLARY: Glucose-Capillary: 82 mg/dL (ref 70–99)

## 2013-02-09 MED ORDER — HYDROMORPHONE HCL PF 1 MG/ML IJ SOLN
INTRAMUSCULAR | Status: AC
  Filled 2013-02-09: qty 1

## 2013-02-09 MED ORDER — HYDROMORPHONE HCL 1 MG/ML IJ SOLN
1.0000 mg | Freq: Once | INTRAMUSCULAR | Status: AC
  Administered 2013-02-09: 1 mg via INTRAMUSCULAR

## 2013-02-09 MED ORDER — HYDROMORPHONE HCL 1 MG/ML IJ SOLN: 1.0000 mg | Freq: Once | INTRAMUSCULAR | Status: DC

## 2013-02-09 MED ORDER — HYDROCODONE-ACETAMINOPHEN 5-325 MG PO TABS: 1.0000 | ORAL_TABLET | Freq: Four times a day (QID) | ORAL | Status: DC | PRN

## 2013-02-09 MED ORDER — SULFAMETHOXAZOLE-TRIMETHOPRIM 800-160 MG PO TABS: 1.0000 | ORAL_TABLET | Freq: Two times a day (BID) | ORAL | Status: AC

## 2013-02-09 NOTE — ED Notes (Signed)
Pt c/o lower back pain onset Sunday... Reports pain radiates to lower abd... She is 32 weeks preg... Has notice urinary freq/urgency.... Has an 35 month old that she's constantly picking up... Denies: fevers, dysuria, hematuria... Hx of kidney stones... Alert w/no signs of acute distress.

## 2013-02-09 NOTE — ED Provider Notes (Signed)
Medical screening examination/treatment/procedure(s) were performed by resident physician or non-physician practitioner and as supervising physician I was immediately available for consultation/collaboration.   Sarina Robleto DOUGLAS MD.   Aahana Elza D Peter Daquila, MD 02/09/13 1743 

## 2013-02-09 NOTE — ED Provider Notes (Signed)
CSN: [id number]     Arrival date & time 02/09/13  1339 History     First MD Initiated Contact with Patient 02/09/13 1528     Chief Complaint  Patient presents with  . Back Pain   (Consider location/radiation/quality/duration/timing/severity/associated sxs/prior Treatment) HPI  37 yo wf [redacted] weeks pregnant presents today with right flank pain that radiates to lower abd x 4 days.  States she has a hx of kidney stones with previous pregnancy and current symptoms feel similar.  Denies fever, chills, dysuria, hematuria, nausea,vomiting.   Has increased urinary frequency.    Past Medical History  Diagnosis Date  . Abnormal Pap smear   . Raynaud's disease    Past Surgical History  Procedure Laterality Date  . Gynecologic cryosurgery    . Appendectomy    . Mass excision  06/30/2012    Procedure: MINOR EXCISION OF MASS;  Surgeon: Emelia Loron, MD;  Location: [facility];  Service: General;  Laterality: Left;   Family History  Problem Relation Age of Onset  . Hypertension Father   . Asthma Father   . Diabetes Brother     type1  . Mental retardation Brother     half brother down's  . Hypertension Brother   . Diabetes Maternal Aunt     type2  . Diabetes Maternal Grandfather     type2  . Heart disease Maternal Grandfather   . Cancer Paternal Grandmother     colon and pituitary tumor  . Asthma Sister   . Hyperlipidemia Mother   . GER disease Mother   . Heart disease Maternal Grandmother   . Stroke Paternal Grandfather    History  Substance Use Topics  . Smoking status: Never Smoker   . Smokeless tobacco: Never Used  . Alcohol Use: No   OB History   Grav Para Term Preterm Abortions TAB SAB Ect Mult Living   2 1 1       1      Review of Systems  Constitutional: Negative.        [redacted] weeks pregnant.  HENT: Negative.   Eyes: Negative.   Respiratory: Negative.   Cardiovascular: Negative.   Gastrointestinal: Positive for abdominal pain.  Endocrine:  Negative.   Genitourinary: Positive for frequency and flank pain. Negative for dysuria, hematuria, vaginal bleeding and genital sores.    Allergies  Benadryl; Coconut fatty acids; and Pineapple  Home Medications   Current Outpatient Rx  Name  Route  Sig  Dispense  Refill  . Prenatal Vit-Fe Fumarate-FA (PRENATAL MULTIVITAMIN) TABS   Oral   Take 1 tablet by mouth daily.         Marland Kitchen HYDROcodone-acetaminophen (NORCO) 5-325 MG per tablet   Oral   Take 1 tablet by mouth every 6 (six) hours as needed for pain.   40 tablet   0   . sulfamethoxazole-trimethoprim (BACTRIM DS,SEPTRA DS) 800-160 MG per tablet   Oral   Take 1 tablet by mouth 2 (two) times daily.   10 tablet   0    BP 119/64  Pulse 82  Temp(Src) 98.4 F (36.9 C) (Oral)  Resp 22  SpO2 100%  Breastfeeding? No Physical Exam  Constitutional: She is oriented to person, place, and time. She appears well-developed and well-nourished.  HENT:  Head: Normocephalic and atraumatic.  Eyes: EOM are normal. Pupils are equal, round, and reactive to light.  Neck: Normal range of motion.  Cardiovascular: Normal rate, regular rhythm and normal heart sounds.  Pulmonary/Chest: Effort normal and breath sounds normal.  Musculoskeletal:  Right CVA tenderness.   Neurological: She is alert and oriented to person, place, and time.  Psychiatric: She has a normal mood and affect.    ED Course   Procedures (including critical care time)  Labs Reviewed  POCT URINALYSIS DIP (DEVICE) - Abnormal; Notable for the following:    Glucose, UA 250 (*)    Hgb urine dipstick LARGE (*)    Protein, ur >=300 (*)    Nitrite POSITIVE (*)    Leukocytes, UA MODERATE (*)    All other components within normal limits  URINE CULTURE  GLUCOSE, CAPILLARY   No results found. 1. UTI (urinary tract infection)         Possible kidney stone.    MDM  Spoke with obgyn Dr Huel Cote.  Ok to give bactrim ds 1 po bid x 5 days and norco 5/325 po q6 hrs  prn pain.  Dilaudid 1 mg im now.  She will have family drive her home today.  F/u with Dr Senaida Ores in one week.  If symptoms worsen, she will go to [facility]. All questions answered.   Meds ordered this encounter  Medications  . sulfamethoxazole-trimethoprim (BACTRIM DS,SEPTRA DS) 800-160 MG per tablet    Sig: Take 1 tablet by mouth 2 (two) times daily.    Dispense:  10 tablet    Refill:  0  . HYDROcodone-acetaminophen (NORCO) 5-325 MG per tablet    Sig: Take 1 tablet by mouth every 6 (six) hours as needed for pain.    Dispense:  40 tablet    Refill:  0  . HYDROmorphone (DILAUDID) injection 1 mg    Sig:     . Marland Kitchen    Zonia Kief, PA-C 02/09/13 1559

## 2013-02-09 NOTE — ED Notes (Signed)
Pt hydrating w/ second cup of water at this time - still awaiting her to produce a urine specimen.

## 2013-02-10 LAB — URINE CULTURE: Colony Count: NO GROWTH

## 2013-03-06 LAB — OB RESULTS CONSOLE GBS: GBS: NEGATIVE

## 2013-03-28 ENCOUNTER — Telehealth (HOSPITAL_COMMUNITY): Payer: Self-pay | Admitting: *Deleted

## 2013-03-28 ENCOUNTER — Encounter (HOSPITAL_COMMUNITY): Payer: Self-pay | Admitting: *Deleted

## 2013-03-28 NOTE — Telephone Encounter (Signed)
Preadmission screen  

## 2013-04-05 ENCOUNTER — Inpatient Hospital Stay (HOSPITAL_COMMUNITY): Payer: 59 | Admitting: Anesthesiology

## 2013-04-05 ENCOUNTER — Encounter (HOSPITAL_COMMUNITY): Payer: Self-pay | Admitting: Anesthesiology

## 2013-04-05 ENCOUNTER — Encounter (HOSPITAL_COMMUNITY): Payer: Self-pay

## 2013-04-05 ENCOUNTER — Inpatient Hospital Stay (HOSPITAL_COMMUNITY)
Admission: RE | Admit: 2013-04-05 | Discharge: 2013-04-07 | DRG: 775 | Disposition: A | Discharge status: Home / Self Care | Payer: 59 | Source: Ambulatory Visit | Attending: Obstetrics and Gynecology | Admitting: Obstetrics and Gynecology

## 2013-04-05 DIAGNOSIS — O09529 Supervision of elderly multigravida, unspecified trimester: Secondary | ICD-10-CM | POA: Diagnosis present

## 2013-04-05 LAB — CBC
HCT: 38.5 % (ref 36.0–46.0)
Hemoglobin: 13.3 g/dL (ref 12.0–15.0)
WBC: 14.5 10*3/uL — ABNORMAL HIGH (ref 4.0–10.5)

## 2013-04-05 MED ORDER — ONDANSETRON HCL 4 MG PO TABS: 4.0000 mg | ORAL_TABLET | ORAL | Status: DC | PRN

## 2013-04-05 MED ORDER — BUTORPHANOL TARTRATE 1 MG/ML IJ SOLN: 1.0000 mg | INTRAMUSCULAR | Status: DC | PRN

## 2013-04-05 MED ORDER — EPHEDRINE 5 MG/ML INJ
10.0000 mg | INTRAVENOUS | Status: DC | PRN
  Filled 2013-04-05: qty 2
  Filled 2013-04-05: qty 4

## 2013-04-05 MED ORDER — ACETAMINOPHEN 325 MG PO TABS: 650.0000 mg | ORAL_TABLET | ORAL | Status: DC | PRN

## 2013-04-05 MED ORDER — OXYCODONE-ACETAMINOPHEN 5-325 MG PO TABS: 1.0000 | ORAL_TABLET | ORAL | Status: DC | PRN

## 2013-04-05 MED ORDER — PHENYLEPHRINE 40 MCG/ML (10ML) SYRINGE FOR IV PUSH (FOR BLOOD PRESSURE SUPPORT)
80.0000 ug | PREFILLED_SYRINGE | INTRAVENOUS | Status: DC | PRN
  Filled 2013-04-05: qty 5
  Filled 2013-04-05: qty 2

## 2013-04-05 MED ORDER — TERBUTALINE SULFATE 1 MG/ML IJ SOLN: 0.2500 mg | Freq: Once | INTRAMUSCULAR | Status: DC | PRN

## 2013-04-05 MED ORDER — LACTATED RINGERS IV SOLN: 500.0000 mL | INTRAVENOUS | Status: DC | PRN

## 2013-04-05 MED ORDER — OXYTOCIN 40 UNITS IN LACTATED RINGERS INFUSION - SIMPLE MED
1.0000 m[IU]/min | INTRAVENOUS | Status: DC
  Administered 2013-04-05: 2 m[IU]/min via INTRAVENOUS
  Filled 2013-04-05: qty 1000

## 2013-04-05 MED ORDER — PHENYLEPHRINE 40 MCG/ML (10ML) SYRINGE FOR IV PUSH (FOR BLOOD PRESSURE SUPPORT)
80.0000 ug | PREFILLED_SYRINGE | INTRAVENOUS | Status: DC | PRN
  Filled 2013-04-05: qty 2

## 2013-04-05 MED ORDER — DIPHENHYDRAMINE HCL 50 MG/ML IJ SOLN: 12.5000 mg | INTRAMUSCULAR | Status: DC | PRN

## 2013-04-05 MED ORDER — TETANUS-DIPHTH-ACELL PERTUSSIS 5-2.5-18.5 LF-MCG/0.5 IM SUSP: 0.5000 mL | Freq: Once | INTRAMUSCULAR | Status: DC

## 2013-04-05 MED ORDER — IBUPROFEN 600 MG PO TABS
600.0000 mg | ORAL_TABLET | Freq: Four times a day (QID) | ORAL | Status: DC
  Administered 2013-04-06 – 2013-04-07 (×6): 600 mg via ORAL
  Filled 2013-04-05 (×5): qty 1

## 2013-04-05 MED ORDER — OXYTOCIN 40 UNITS IN LACTATED RINGERS INFUSION - SIMPLE MED: 62.5000 mL/h | INTRAVENOUS | Status: DC

## 2013-04-05 MED ORDER — ZOLPIDEM TARTRATE 5 MG PO TABS: 5.0000 mg | ORAL_TABLET | Freq: Every evening | ORAL | Status: DC | PRN

## 2013-04-05 MED ORDER — LANOLIN HYDROUS EX OINT: TOPICAL_OINTMENT | CUTANEOUS | Status: DC | PRN

## 2013-04-05 MED ORDER — IBUPROFEN 600 MG PO TABS: 600.0000 mg | ORAL_TABLET | Freq: Four times a day (QID) | ORAL | Status: DC | PRN

## 2013-04-05 MED ORDER — EPHEDRINE 5 MG/ML INJ
10.0000 mg | INTRAVENOUS | Status: DC | PRN
  Filled 2013-04-05: qty 2

## 2013-04-05 MED ORDER — BENZOCAINE-MENTHOL 20-0.5 % EX AERO
1.0000 "application " | INHALATION_SPRAY | CUTANEOUS | Status: DC | PRN
  Administered 2013-04-06 – 2013-04-07 (×2): 1 via TOPICAL
  Filled 2013-04-05 (×2): qty 56

## 2013-04-05 MED ORDER — SENNOSIDES-DOCUSATE SODIUM 8.6-50 MG PO TABS
2.0000 | ORAL_TABLET | ORAL | Status: DC
  Administered 2013-04-06 – 2013-04-07 (×2): 2 via ORAL

## 2013-04-05 MED ORDER — SIMETHICONE 80 MG PO CHEW: 80.0000 mg | CHEWABLE_TABLET | ORAL | Status: DC | PRN

## 2013-04-05 MED ORDER — DIBUCAINE 1 % RE OINT: 1.0000 "application " | TOPICAL_OINTMENT | RECTAL | Status: DC | PRN

## 2013-04-05 MED ORDER — CITRIC ACID-SODIUM CITRATE 334-500 MG/5ML PO SOLN: 30.0000 mL | ORAL | Status: DC | PRN

## 2013-04-05 MED ORDER — OXYCODONE-ACETAMINOPHEN 5-325 MG PO TABS
1.0000 | ORAL_TABLET | ORAL | Status: DC | PRN
  Administered 2013-04-06 – 2013-04-07 (×2): 1 via ORAL
  Filled 2013-04-05 (×2): qty 1

## 2013-04-05 MED ORDER — LACTATED RINGERS IV SOLN
500.0000 mL | Freq: Once | INTRAVENOUS | Status: AC
  Administered 2013-04-05: 1000 mL via INTRAVENOUS

## 2013-04-05 MED ORDER — WITCH HAZEL-GLYCERIN EX PADS: 1.0000 "application " | MEDICATED_PAD | CUTANEOUS | Status: DC | PRN

## 2013-04-05 MED ORDER — OXYTOCIN BOLUS FROM INFUSION: 500.0000 mL | INTRAVENOUS | Status: DC

## 2013-04-05 MED ORDER — ONDANSETRON HCL 4 MG/2ML IJ SOLN: 4.0000 mg | Freq: Four times a day (QID) | INTRAMUSCULAR | Status: DC | PRN

## 2013-04-05 MED ORDER — LACTATED RINGERS IV SOLN
INTRAVENOUS | Status: DC
  Administered 2013-04-05 (×2): via INTRAVENOUS

## 2013-04-05 MED ORDER — ONDANSETRON HCL 4 MG/2ML IJ SOLN: 4.0000 mg | INTRAMUSCULAR | Status: DC | PRN

## 2013-04-05 MED ORDER — PRENATAL MULTIVITAMIN CH: 1.0000 | ORAL_TABLET | Freq: Every day | ORAL | Status: DC

## 2013-04-05 MED ORDER — FENTANYL 2.5 MCG/ML BUPIVACAINE 1/10 % EPIDURAL INFUSION (WH - ANES)
14.0000 mL/h | INTRAMUSCULAR | Status: DC | PRN
  Administered 2013-04-05: 14 mL/h via EPIDURAL
  Filled 2013-04-05: qty 125

## 2013-04-05 MED ORDER — LIDOCAINE HCL (PF) 1 % IJ SOLN
30.0000 mL | INTRAMUSCULAR | Status: DC | PRN
  Filled 2013-04-05: qty 30

## 2013-04-05 MED ORDER — LIDOCAINE HCL (PF) 1 % IJ SOLN
INTRAMUSCULAR | Status: DC | PRN
  Administered 2013-04-05 (×4): 4 mL

## 2013-04-05 NOTE — Progress Notes (Signed)
Patient ID: Cheryl Buck, female   DOB: 1976-07-11, 37 y.o.   MRN: [id number] Pt here for IOL.  Not yet started on pitocin. Few contractions  FHR reactive Cervix  70/2/-2 Attempted AROM with scant fluid noted  Will begin pitocin and follow progress.

## 2013-04-05 NOTE — Anesthesia Procedure Notes (Signed)
Epidural Patient location during procedure: OB Start time: 04/05/2013 3:30 PM  Staffing Performed by: anesthesiologist   Preanesthetic Checklist Completed: patient identified, site marked, surgical consent, pre-op evaluation, timeout performed, IV checked, risks and benefits discussed and monitors and equipment checked  Epidural Patient position: sitting Prep: site prepped and draped and DuraPrep Patient monitoring: continuous pulse ox and blood pressure Approach: midline Injection technique: LOR air  Needle:  Needle type: Tuohy  Needle gauge: 17 G Needle length: 9 cm and 9 Needle insertion depth: 4 cm Catheter type: closed end flexible Catheter size: 19 Gauge Catheter at skin depth: 9 cm Test dose: negative  Assessment Events: blood not aspirated, injection not painful, no injection resistance, negative IV test and no paresthesia  Additional Notes Discussed risk of headache, infection, bleeding, nerve injury and failed or incomplete block.  Patient voices understanding and wishes to proceed.  Epidural placed easily on first attempt.  No paresthesia.  Patient tolerated procedure well with no apparent complications.  Jasmine December, MDReason for block:procedure for pain

## 2013-04-05 NOTE — Progress Notes (Signed)
Patient ID: Cheryl Buck, female   DOB: 1976/01/30, 37 y.o.   MRN: [id number] Pt minimally uncomfortable afeb vss FHR reactive  Ctx q 2-3 min AROM forebag clear Cervix 60/2-3/-2  Pitocin at 8 mu Follow progress.

## 2013-04-05 NOTE — Progress Notes (Signed)
   Subjective: Comfortable with epidural  Objective: BP 112/61  Pulse 70  Temp(Src) 98 F (36.7 C) (Oral)  Resp 18  Ht 5\' 9"  (1.753 m)  Wt 78.472 kg (173 lb)  BMI 25.54 kg/m2  SpO2 100%  LMP 07/03/2012      FHT:  FHR: 140 bpm, variability: moderate,  accelerations:  Present,  decelerations:  Absent UC:   regular, every 2-3 minutes SVE:   Dilation: 4 Effacement (%): 90 Station: -1 Exam by:: dr Senaida Ores IUPC placed to adjust pitocin  Labs: Lab Results  Component Value Date   WBC 14.5* 04/05/2013   HGB 13.3 04/05/2013   HCT 38.5 04/05/2013   MCV 88.5 04/05/2013   PLT 167 04/05/2013    Assessment / Plan: Making progress, hopefully entering active phase of labor Cheryl Buck W 04/05/2013, 5:48 PM

## 2013-04-05 NOTE — Anesthesia Preprocedure Evaluation (Addendum)
Anesthesia Evaluation  Patient identified by MRN, date of birth, ID band Patient awake    Reviewed: Allergy & Precautions, H&P , NPO status , Patient's Chart, lab work & pertinent test results, reviewed documented beta blocker date and time   History of Anesthesia Complications Negative for: history of anesthetic complications  Airway Mallampati: I TM Distance: >3 FB Neck ROM: full    Dental  (+) Teeth Intact   Pulmonary neg pulmonary ROS,  breath sounds clear to auscultation        Cardiovascular + Peripheral Vascular Disease (raynaud's) Rhythm:regular Rate:Normal     Neuro/Psych negative neurological ROS  negative psych ROS   GI/Hepatic Neg liver ROS, GERD-  Medicated,  Endo/Other  negative endocrine ROS  Renal/GU negative Renal ROS  negative genitourinary   Musculoskeletal   Abdominal   Peds  Hematology negative hematology ROS (+)   Anesthesia Other Findings   Reproductive/Obstetrics (+) Pregnancy                          Anesthesia Physical Anesthesia Plan  ASA: II  Anesthesia Plan: Epidural   Post-op Pain Management:    Induction:   Airway Management Planned:   Additional Equipment:   Intra-op Plan:   Post-operative Plan:   Informed Consent: I have reviewed the patients History and Physical, chart, labs and discussed the procedure including the risks, benefits and alternatives for the proposed anesthesia with the patient or authorized representative who has indicated his/her understanding and acceptance.     Plan Discussed with:   Anesthesia Plan Comments:         Anesthesia Quick Evaluation

## 2013-04-05 NOTE — H&P (Signed)
Cheryl Buck is a 37 y.o. female G2P1001 at 39+ weeks (EDD 04/09/13 by LMP c/w 9 week [place])  presenting for IOL at term with a favorable cervix.  Prenatal care has been uneventful.  Maternal Medical History:  Contractions: Frequency: irregular.   Perceived severity is mild.    Fetal activity: Perceived fetal activity is normal.    Prenatal Complications - Diabetes: none.    OB History   Grav Para Term Preterm Abortions TAB SAB Ect Mult Living   2 1 1       1     2013 NSVD 8#9oz  Past Medical History  Diagnosis Date  . Abnormal Pap smear   . Raynaud's disease    Past Surgical History  Procedure Laterality Date  . Gynecologic cryosurgery    . Appendectomy    . Mass excision  06/30/2012    Procedure: MINOR EXCISION OF MASS;  Surgeon: Emelia Loron, MD;  Location: [facility];  Service: General;  Laterality: Left;   Family History: family history includes Asthma in her father and sister; Cancer in her paternal grandmother; Diabetes in her brother, maternal aunt, and maternal grandfather; GER disease in her mother; Heart disease in her maternal grandfather and maternal grandmother; Hyperlipidemia in her mother; Hypertension in her brother and father; Mental retardation in her brother; Stroke in her paternal grandfather. Social History:  reports that she has never smoked. She has never used smokeless tobacco. She reports that she does not drink alcohol or use illicit drugs.   Prenatal Transfer Tool  Maternal Diabetes: No Genetic Screening: Normal Maternal Ultrasounds/Referrals: Normal Fetal Ultrasounds or other Referrals:  None Maternal Substance Abuse:  No Significant Maternal Medications:  None Significant Maternal Lab Results:  None Other Comments:  None  ROS    Last menstrual period 07/03/2012, not currently breastfeeding. Maternal Exam:  Uterine Assessment: Contraction strength is mild.  Contraction frequency is irregular.   Abdomen: Fetal  presentation: vertex  Introitus: Normal vulva. Normal vagina.    Physical Exam  Constitutional: She is oriented to person, place, and time. She appears well-developed and well-nourished.  Cardiovascular: Normal rate and regular rhythm.   Respiratory: Effort normal.  GI: Soft.  Genitourinary: Vagina normal.  Neurological: She is alert and oriented to person, place, and time.  Psychiatric: She has a normal mood and affect. Her behavior is normal.    Prenatal labs: ABO, Rh:  O positive (01/14/13) Antibody:  negative Rubella:  Immune RPR:   NR HBsAg:  Neg  HIV:   NR GBS: Negative (08/18 0000)  GC neg Chlam neg One hour GTT 129 First trimester screen and AFP negative  Assessment/Plan Pt admitted for IOL at term.  Plan pitocin and AROM.   Oliver Pila 04/05/2013, 6:32 AM

## 2013-04-06 ENCOUNTER — Encounter (HOSPITAL_COMMUNITY): Payer: Self-pay

## 2013-04-06 LAB — CBC
Hemoglobin: 11.6 g/dL — ABNORMAL LOW (ref 12.0–15.0)
MCH: 30.6 pg (ref 26.0–34.0)
MCV: 88.9 fL (ref 78.0–100.0)
Platelets: 143 10*3/uL — ABNORMAL LOW (ref 150–400)
RBC: 3.79 MIL/uL — ABNORMAL LOW (ref 3.87–5.11)
WBC: 14.2 10*3/uL — ABNORMAL HIGH (ref 4.0–10.5)

## 2013-04-06 NOTE — Lactation Note (Signed)
This note was copied from the chart of Cheryl Buck. Lactation Consultation Note   Initial consult with this mom and baby, now 17 hours post partum. Mom was concerned that the baby had not fed well since 8 am (6 hours). Mom was attempting to latch baby when I walked into the room, in a cradle hold, placing the breast in the baby's mouth, and the baby was  semi supine with mom leaning forward. I repositioned mom and baby, showed her cross cradle, and after a few tries, she latched well with strong suckles for 15 minutes (at least), with audible swallows, and strong pulls. Mom was relieved, and admitted  to forgetting  A lot about the first few days of breast feeding. The breast feeding pages of the baby and me book pages on breast feeding reviewed with mom, and the lactation and community services reviewed with mom. Mom knows to call for questions/concerns  Patient Name: Cheryl Buck [id number] Date: 04/06/2013 Reason for consult: Initial assessment   Maternal Data Formula Feeding for Exclusion: No Infant to breast within first hour of birth: Yes Has patient been taught Hand Expression?: Yes Does the patient have breastfeeding experience prior to this delivery?: Yes  Feeding Feeding Type: Breast Milk Length of feed: 15 min  LATCH Score/Interventions Latch: Repeated attempts needed to sustain latch, nipple held in mouth throughout feeding, stimulation needed to elicit sucking reflex. Intervention(s): Adjust position;Assist with latch;Breast compression  Audible Swallowing: Spontaneous and intermittent  Type of Nipple: Everted at rest and after stimulation  Comfort (Breast/Nipple): Soft / non-tender     Hold (Positioning): Assistance needed to correctly position infant at breast and maintain latch. Intervention(s): Breastfeeding basics reviewed;Support Pillows;Position options;Skin to skin  LATCH Score: 8  Lactation Tools Discussed/Used     Consult Status Consult Status:  Follow-up Date: 04/07/13 Follow-up type: In-patient    Alfred Levins 04/06/2013, 2:53 PM

## 2013-04-06 NOTE — Anesthesia Postprocedure Evaluation (Signed)
  Anesthesia Post-op Note  Patient: Cheryl Buck  Procedure(s) Performed: * No procedures listed *  Patient Location: Mother/Baby  Anesthesia Type:Epidural  Level of Consciousness: awake  Airway and Oxygen Therapy: Patient Spontanous Breathing  Post-op Pain: none  Post-op Assessment: Patient's Cardiovascular Status Stable, Respiratory Function Stable, Patent Airway, No signs of Nausea or vomiting, Adequate PO intake, Pain level controlled, No headache, No backache, No residual numbness and No residual motor weakness  Post-op Vital Signs: Reviewed and stable  Complications: No apparent anesthesia complications

## 2013-04-06 NOTE — Progress Notes (Signed)
Post Partum Day 1 Subjective: no complaints and tolerating PO  Objective: Blood pressure 102/65, pulse 74, temperature 99 F (37.2 C), temperature source Oral, resp. rate 18, height 5\' 9"  (1.753 m), weight 78.472 kg (173 lb), last menstrual period 07/03/2012, SpO2 98.00%, unknown if currently breastfeeding.  Physical Exam:  General: alert and cooperative Lochia: appropriate Uterine Fundus: firm    Recent Labs  04/05/13 0800 04/06/13 0610  HGB 13.3 11.6*  HCT 38.5 33.7*    Assessment/Plan: Plan for discharge tomorrow   LOS: 1 day   Kameka Whan W 04/06/2013, 9:01 AM

## 2013-04-07 MED ORDER — INFLUENZA VAC SPLIT QUAD 0.5 ML IM SUSP
0.5000 mL | INTRAMUSCULAR | Status: AC
  Administered 2013-04-07: 0.5 mL via INTRAMUSCULAR
  Filled 2013-04-07: qty 0.5

## 2013-04-07 MED ORDER — IBUPROFEN 600 MG PO TABS: 600.0000 mg | ORAL_TABLET | Freq: Four times a day (QID) | ORAL | Status: DC

## 2013-04-07 NOTE — Progress Notes (Signed)
Patient ID: Cheryl Buck, female   DOB: 1975-08-06, 37 y.o.   MRN: [id number] PPD 2 Doing well.   No c/o afeb VSS Fundus firm. D/c home

## 2013-04-07 NOTE — Discharge Summary (Signed)
Obstetric Discharge Summary Reason for Admission: induction of labor Prenatal Procedures: none Intrapartum Procedures: spontaneous vaginal delivery Postpartum Procedures: none Complications-Operative and Postpartum: second degree perineal laceration Hemoglobin  Date Value Range Status  04/06/2013 11.6* 12.0 - 15.0 g/dL Final     HCT  Date Value Range Status  04/06/2013 33.7* 36.0 - 46.0 % Final    Physical Exam:  General: alert and cooperative Lochia: appropriate Uterine Fundus: firm   Discharge Diagnoses: Term Pregnancy-delivered  Discharge Information: Date: 04/07/2013 Activity: pelvic rest Diet: routine Medications: Ibuprofen Condition: improved Instructions: refer to practice specific booklet Discharge to: home Follow-up Information   Follow up with Oliver Pila, MD In 6 weeks. (postpartum exam)    Specialty:  Obstetrics and Gynecology   Contact information:   [address], [address] [place] [address] [phone]       Newborn Data: Live born female  Birth Weight: 8 lb 6.6 oz (3816 g) APGAR: 9, 9  Home with mother.  Oliver Pila 04/07/2013, 7:39 AM

## 2013-04-09 ENCOUNTER — Inpatient Hospital Stay (HOSPITAL_COMMUNITY): Admission: AD | Admit: 2013-04-09 | Payer: Self-pay | Source: Ambulatory Visit | Admitting: Obstetrics and Gynecology

## 2013-04-11 ENCOUNTER — Ambulatory Visit (HOSPITAL_COMMUNITY)
Admission: RE | Admit: 2013-04-11 | Discharge: 2013-04-11 | Disposition: A | Discharge status: Home / Self Care | Payer: 59 | Source: Ambulatory Visit | Attending: Obstetrics and Gynecology | Admitting: Obstetrics and Gynecology

## 2013-04-11 NOTE — Lactation Note (Signed)
Infant Lactation Consultation Outpatient Visit Note  Patient Name: Cheryl Buck Sheets Date of Birth: 17-Jun-1976                                    04/05/13 Birth Weight:  of [name] on 9/17     8 lbs 6 oz           9/19 was 7lb 15 oz on discharge to home   9/22 was 7 lb 10 oz    Today 9/23    Up to 7 lbs 13 oz (without diaper) Gestational Age at Delivery: Gestational Age: <None>  Full term Type of Delivery:   Breastfeeding History Frequency of Breastfeeding: every 3 hours during the day, and every 30-45 minutes at night Length of Feeding: 5 minutes of active sucking, and then need stimulation to wake up Voids: 6 Stools: 4-6 stools per 24 hours   Supplementing / Method: Pumping:  Type of Pump:Medela PIS DEP   Frequency:3-4 times a day  Volume:  30 from one breast  Comments:  Mom pumps the breast the baby does not feed from, and the supplements with EBM at night    Consultation Evaluation:     Follow up lactation consult with this mom and term baby, now 26 days old. Mom is an experienced breast feeder, breast fed her 34 month old for 10 months.With her first breast feeding experience, breast feeding was comfortable, with intact and normal nipple tissue.  Mom reports very sore nipples both while breastfeeding, and in between, with [facility]. She has old scabs which are now white, almost looking like huge blisters on the tip of both nippls. Mom reports not feeling the baby's tongue around her breast, like she did with her first child. [name] was able to transfer 58 mls from the breast, with about 10 minutes of sucking on each breast.  [name] gets tired after about 8-9 minutes at the breast, and mom needs to stimulate her to finish her feeding. Mom has a good milk supply. I did note that olivia's tongue has a slight cleft in it on extension. She can extend her tongue just past her bottom gum line, but with finger sucking, she has her tongue pulled back, which is  probably causing mom to get pinched and sore. I observed mom's nipples with a pinching stripe with unlatching. [name]'s frenulum is set posterior, but blanches with tongue movement. I feel [name] has limited tongue movement.   [name] also tends to shape her tongue like a bowl with elevation. Due to mom's continued severe pain and nipple excoriation with breast feeding, and the oral findings with [name], I suggested mom consult with her pediatrician for advice.   Initial Feeding Assessment: Pre-feed [id number] Post-feed [id number] Amount Transferred:36  [name] was actively sucking for first 8-9 minutes, then fell asleep and needed lots of  of stimulation for next 3 minutes  Additional Feeding Assessment: Pre-feed [id number] Post-feed [id number] Amount Transferred:22 Comments:  Additional Feeding Assessment: Pre-feed Weight: Post-feed Weight: Amount Transferred: Comments:  Total Breast milk Transferred this Visit: 58 mls Total Supplement Given: none  Additional Interventions: Mom to follow up with Dr. Loyola Mast   Follow-Up  MOm will come to Breast feeding support group for weight checks, and call lactation for follow up  as needed      Alfred Levins 04/11/2013, 1:07 PM

## 2013-04-14 ENCOUNTER — Ambulatory Visit (HOSPITAL_COMMUNITY): Admission: RE | Admit: 2013-04-14 | Payer: 59 | Source: Ambulatory Visit

## 2014-05-21 ENCOUNTER — Encounter (HOSPITAL_COMMUNITY): Payer: Self-pay

## 2015-08-19 ENCOUNTER — Ambulatory Visit (INDEPENDENT_AMBULATORY_CARE_PROVIDER_SITE_OTHER): Payer: 59 | Admitting: Family Medicine

## 2015-08-19 ENCOUNTER — Ambulatory Visit (INDEPENDENT_AMBULATORY_CARE_PROVIDER_SITE_OTHER): Payer: 59

## 2015-08-19 VITALS — BP 118/80 | HR 73 | Temp 97.9°F | Resp 18 | Ht 67.75 in | Wt 141.8 lb

## 2015-08-19 DIAGNOSIS — R05 Cough: Secondary | ICD-10-CM

## 2015-08-19 DIAGNOSIS — J0100 Acute maxillary sinusitis, unspecified: Secondary | ICD-10-CM

## 2015-08-19 DIAGNOSIS — R0602 Shortness of breath: Secondary | ICD-10-CM

## 2015-08-19 DIAGNOSIS — J22 Unspecified acute lower respiratory infection: Secondary | ICD-10-CM

## 2015-08-19 DIAGNOSIS — J988 Other specified respiratory disorders: Secondary | ICD-10-CM

## 2015-08-19 DIAGNOSIS — R059 Cough, unspecified: Secondary | ICD-10-CM

## 2015-08-19 MED ORDER — LEVOFLOXACIN 500 MG PO TABS: 500.0000 mg | ORAL_TABLET | Freq: Every day | ORAL | Status: DC

## 2015-08-19 MED ORDER — HYDROCODONE-HOMATROPINE 5-1.5 MG/5ML PO SYRP: 5.0000 mL | ORAL_SOLUTION | Freq: Every evening | ORAL | Status: DC | PRN

## 2015-08-19 MED ORDER — BENZONATATE 200 MG PO CAPS: 200.0000 mg | ORAL_CAPSULE | Freq: Two times a day (BID) | ORAL | Status: DC | PRN

## 2015-08-19 NOTE — Progress Notes (Signed)
Chief Complaint:  Chief Complaint  Patient presents with  . URI    x2 weeks    HPI: Cheryl Buck is a 40 y.o. female who reports to [facility] today complaining of  3 week hx of sinus sxs and cough and now going down to chest, undulating fevers. No ear pain. Has tried sudafed. Has SOB with exertion. Chunky cough , productive yellow sputum.  She has had chills, feels tired, works in [employer].  Past Medical History  Diagnosis Date  . Abnormal Pap smear   . Raynaud's disease    Past Surgical History  Procedure Laterality Date  . Gynecologic cryosurgery    . Appendectomy    . Mass excision  06/30/2012    Procedure: MINOR EXCISION OF MASS;  Surgeon: Rolm Bookbinder, MD;  Location: [place];  Service: General;  Laterality: Left;   Social History   Social History  . Marital Status: Married    Spouse Name: N/A  . Number of Children: N/A  . Years of Education: N/A   Social History Main Topics  . Smoking status: Never Smoker   . Smokeless tobacco: Never Used  . Alcohol Use: No  . Drug Use: No  . Sexual Activity: Yes   Other Topics Concern  . None   Social History Narrative   Family History  Problem Relation Age of Onset  . Hypertension Father   . Asthma Father   . Diabetes Brother     type1  . Mental retardation Brother     half brother down's  . Hypertension Brother   . Diabetes Maternal Aunt     type2  . Diabetes Maternal Grandfather     type2  . Heart disease Maternal Grandfather   . Cancer Paternal Grandmother     colon and pituitary tumor  . Asthma Sister   . Hyperlipidemia Mother   . GER disease Mother   . Heart disease Maternal Grandmother   . Stroke Paternal Grandfather    Allergies  Allergen Reactions  . 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 Coconut Fatty Acids Swelling  . Pineapple Swelling   Prior to Admission medications   Medication Sig Start Date End Date Taking? Authorizing  Provider  ibuprofen (ADVIL,MOTRIN) 600 MG tablet Take 1 tablet (600 mg total) by mouth every 6 (six) hours. Patient not taking: Reported on 08/19/2015 04/07/13   Paula Compton, MD  Prenatal Vit-Fe Fumarate-FA (PRENATAL MULTIVITAMIN) TABS Take 1 tablet by mouth daily. Reported on 08/19/2015    Historical Provider, MD  ranitidine (ZANTAC) 75 MG tablet Take 75 mg by mouth 2 (two) times daily. Reported on 08/19/2015    Historical Provider, MD     ROS: The patient denies night sweats, unintentional weight loss, chest pain, palpitations, wheezing, dyspnea on exertion, nausea, vomiting, abdominal pain, dysuria, hematuria, melena, numbness, weakness, or tingling.   All other systems have been reviewed and were otherwise negative with the exception of those mentioned in the HPI and as above.    PHYSICAL EXAM: Filed Vitals:   08/19/15 1802  BP: 118/80  Pulse: 73  Temp: 97.9 F (36.6 C)  Resp: 18   Body mass index is 21.72 kg/(m^2).   General: Alert, no acute distress HEENT:  Normocephalic, atraumatic, oropharynx patent. EOMI, PERRLA Erythematous throat, no exudates, TM normal, + sinus tenderness, + erythematous/boggy nasal mucosa Cardiovascular:  Regular rate and rhythm, no rubs murmurs or gallops.  Respiratory: Clear to auscultation bilaterally.  No wheezes, rales, or rhonchi.  No cyanosis, no use of accessory musculature Abdominal: No organomegaly, abdomen is soft and non-tender, positive bowel sounds. No masses. Skin: No rashes. Neurologic: Facial musculature symmetric. Psychiatric: Patient acts appropriately throughout our interaction. Lymphatic: No cervical or submandibular lymphadenopathy Musculoskeletal: Gait intact. No edema, tenderness   LABS:    EKG/XRAY:   Primary read interpreted by [facility] Comment at [facility]. streakly left lower lobe, ? Infiltrate vs bronchitis  ASSESSMENT/PLAN: Encounter Diagnoses  Name Primary?  . Cough   . SOB (shortness of breath)   . Lower respiratory  infection (e.g., bronchitis, pneumonia, pneumonitis, pulmonitis) Yes  . Acute maxillary sinusitis, recurrence not specified    Rx Levaquin Rx Tessalon perles and also Hycodan prn  Fu on chest xray    FINDINGS: The heart size and mediastinal contours are within normal limits. Both lungs are clear. The visualized skeletal structures are unremarkable.  IMPRESSION: No active cardiopulmonary disease.   Electronically Signed  By: Kerby Moors M.D.  On: 08/19/2015 19:16  Gross sideeffects, risk and benefits, and alternatives of medications d/w patient. Patient is aware that all medications have potential sideeffects and we are unable to predict every sideeffect or drug-drug interaction that may occur.  Quinn Quam DO  08/19/2015 7:31 PM

## 2015-08-19 NOTE — Patient Instructions (Addendum)
Because you received an x-ray today, you will receive an invoice from [facility]. Please contact [facility] at [phone] with questions or concerns regarding your invoice. Our billing staff will not be able to assist you with those questions. Levofloxacin tablets What is this medicine? LEVOFLOXACIN (lee voe FLOX a sin) is a quinolone antibiotic. It is used to treat certain kinds of bacterial infections. It will not work for colds, flu, or other viral infections. This medicine may be used for other purposes; ask your health care provider or pharmacist if you have questions. What should I tell my health care provider before I take this medicine? They need to know if you have any of these conditions: -bone problems -cerebral disease -history of low levels of potassium in the blood -irregular heartbeat -joint problems -kidney disease -myasthenia gravis -seizures -tendon problems -tingling of the fingers or toes, or other nerve disorder -an unusual or allergic reaction to levofloxacin, other quinolone antibiotics, foods, dyes, or preservatives -pregnant or trying to get pregnant -breast-feeding How should I use this medicine? Take this medicine by mouth with a full glass of water. Follow the directions on the prescription label. This medicine can be taken with or without food. Take your medicine at regular intervals. Do not take your medicine more often than directed. Do not skip doses or stop your medicine early even if you feel better. Do not stop taking except on your doctor's advice. A special MedGuide will be given to you by the pharmacist with each prescription and refill. Be sure to read this information carefully each time. Talk to your pediatrician regarding the use of this medicine in children. While this drug may be prescribed for children as young as 6 months for selected conditions, precautions do apply. Overdosage: If you think you have taken too much of this  medicine contact a poison control center or emergency room at once. NOTE: This medicine is only for you. Do not share this medicine with others. What if I miss a dose? If you miss a dose, take it as soon as you remember. If it is almost time for your next dose, take only that dose. Do not take double or extra doses. What may interact with this medicine? Do not take this medicine with any of the following medications: -arsenic trioxide -chloroquine -droperidol -medicines for irregular heart rhythm like amiodarone, disopyramide, dofetilide, flecainide, quinidine, procainamide, sotalol -some medicines for depression or mental problems like phenothiazines, pimozide, and ziprasidone This medicine may also interact with the following medications: -amoxapine -antacids -birth control pills -cisapride -dairy products -didanosine (ddI) buffered tablets or powder -haloperidol -multivitamins -NSAIDS, medicines for pain and inflammation, like ibuprofen or naproxen -retinoid products like tretinoin or isotretinoin -risperidone -some other antibiotics like clarithromycin or erythromycin -sucralfate -theophylline -warfarin This list may not describe all possible interactions. Give your health care provider a list of all the medicines, herbs, non-prescription drugs, or dietary supplements you use. Also tell them if you smoke, drink alcohol, or use illegal drugs. Some items may interact with your medicine. What should I watch for while using this medicine? Tell your doctor or health care professional if your symptoms do not improve or if they get worse. Drink several glasses of water a day and cut down on drinks that contain caffeine. You must not get dehydrated while taking this medicine. You may get drowsy or dizzy. Do not drive, use machinery, or do anything that needs mental alertness until you know how this medicine affects you. Do not  sit or stand up quickly, especially if you are an older patient.  This reduces the risk of dizzy or fainting spells. This medicine can make you more sensitive to the sun. Keep out of the sun. If you cannot avoid being in the sun, wear protective clothing and use a sunscreen. Do not use sun lamps or tanning beds/booths. Contact your doctor if you get a sunburn. If you are a diabetic monitor your blood glucose carefully. If you get an unusual reading stop taking this medicine and call your doctor right away. Do not treat diarrhea with over-the-counter products. Contact your doctor if you have diarrhea that lasts more than 2 days or if the diarrhea is severe and watery. Avoid antacids, calcium, iron, and zinc products for 2 hours before and 2 hours after taking a dose of this medicine. What side effects may I notice from receiving this medicine? Side effects that you should report to your doctor or health care professional as soon as possible: -allergic reactions like skin rash or hives, swelling of the face, lips, or tongue -anxious -confusion -depressed mood -diarrhea -fast, irregular heartbeat -hallucination, loss of contact with reality -joint, muscle, or tendon pain or swelling -pain, tingling, numbness in the hands or feet -suicidal thoughts or other mood changes -sunburn -unusually weak or tired Side effects that usually do not require medical attention (report to your doctor or health care professional if they continue or are bothersome): -dry mouth -headache -nausea -trouble sleeping This list may not describe all possible side effects. Call your doctor for medical advice about side effects. You may report side effects to FDA at 1-800-FDA-1088. Where should I keep my medicine? Keep out of the reach of children. Store at room temperature between 15 and 30 degrees C (59 and 86 degrees F). Keep in a tightly closed container. Throw away any unused medicine after the expiration date. NOTE: This sheet is a summary. It may not cover all possible  information. If you have questions about this medicine, talk to your doctor, pharmacist, or health care provider.    2016, [employer]vier/[employer] Standard. (2015-02-14 12:40:18)  Homatropine; Hydrocodone oral syrup What is this medicine? HYDROCODONE (hye droe KOE done) is used to help relieve cough. This medicine may be used for other purposes; ask your health care provider or pharmacist if you have questions. What should I tell my health care provider before I take this medicine? They need to know if you have any of these conditions: -brain tumor -drug abuse or addiction -head injury -heart disease -if you frequently drink alcohol-containing drinks -kidney disease -liver disease -lung disease, asthma, or breathing problems -mental problems -an allergic reaction to hydrocodone, other opioid analgesics, other medicines, foods, dyes, or preservatives -pregnant or trying to get pregnant -breast-feeding How should I use this medicine? Take this medicine by mouth with a full glass of water. Use a specially marked spoon or dropper to measure your dose. Ask your pharmacist if you do not have one. Do not use a household spoon. Follow the directions on the prescription label. Take your medicine at regular intervals. Do not take your medicine more often than directed. Talk to your pediatrician regarding the use of this medicine in children. This medicine is not approved for use in children less than 60 years old. Patients over 76 years old may have a stronger reaction and need a smaller dose. Overdosage: If you think you have taken too much of this medicine contact a poison control center or emergency room at  once. NOTE: This medicine is only for you. Do not share this medicine with others. What if I miss a dose? If you miss a dose, take it as soon as you can. If it is almost time for your next dose, take only that dose. Do not take double or extra doses. What may interact with this  medicine? -alcohol -antihistamines -MAOIs like Carbex, Eldepryl, Marplan, Nardil, and Parnate -medicines for depression, anxiety, or psychotic disturbances -medicines for sleep -muscle relaxants -naltrexone -narcotic medicines (opiates) for pain -tramadol -tricyclic antidepressants like amitriptyline, clomipramine, desipramine, doxepin, imipramine, nortriptyline, and protriptyline This list may not describe all possible interactions. Give your health care provider a list of all the medicines, herbs, non-prescription drugs, or dietary supplements you use. Also tell them if you smoke, drink alcohol, or use illegal drugs. Some items may interact with your medicine. What should I watch for while using this medicine? You may develop tolerance to this medicine if you take it for a long time. Tolerance means that you will get less cough relief with time. Tell your doctor or health care professional if you symptoms do not improve or if they get worse. Do not suddenly stop taking your medicine because you may develop a severe reaction. Your body becomes used to the medicine. This does NOT mean you are addicted. Addiction is a behavior related to getting and using a drug for a non-medical reason. If your doctor wants you to stop the medicine, the dose will be slowly lowered over time to avoid any side effects. You may get drowsy or dizzy. Do not drive, use machinery, or do anything that needs mental alertness until you know how this medicine affects you. Do not stand or sit up quickly, especially if you are an older patient. This reduces the risk of dizzy or fainting spells. Alcohol may interfere with the effect of this medicine. Avoid alcoholic drinks. This medicine will cause constipation. Try to have a bowel movement at least every 2 to 3 days. If you do not have a bowel movement for 3 days, call your doctor or health care professional. What side effects may I notice from receiving this medicine? Side  effects that you should report to your doctor or health care professional as soon as possible: -allergic reactions like skin rash, itching or hives, swelling of the face, lips, or tongue -breathing difficulties, wheezing -confusion -light headedness or fainting spells Side effects that usually do not require medical attention (report to your doctor or health care professional if they continue or are bothersome): -dizziness -drowsiness -itching -nausea -vomiting This list may not describe all possible side effects. Call your doctor for medical advice about side effects. You may report side effects to FDA at 1-800-FDA-1088. Where should I keep my medicine? Keep out of the reach of children. This medicine can be abused. Keep your medicine in a safe place to protect it from theft. Do not share this medicine with anyone. Selling or giving away this medicine is dangerous and against the law. This medicine may cause accidental overdose and death if taken by other adults, children, or pets. Mix any unused medicine with a substance like cat littler or coffee grounds. Then throw the medicine away in a sealed container like a sealed bag or a coffee can with a lid. Do not use the medicine after the expiration date. Store at room temperature between 15 and 30 degrees C (59 and 86 degrees F). Protect from light. NOTE: This sheet is a summary. It  may not cover all possible information. If you have questions about this medicine, talk to your doctor, pharmacist, or health care provider.    2016, Elsevier/Gold Standard. (2014-03-10 19:21:02)

## 2015-08-20 ENCOUNTER — Telehealth: Payer: Self-pay | Admitting: Family Medicine

## 2015-08-20 NOTE — Telephone Encounter (Signed)
Lm that xray showed no pneumonia

## 2016-02-13 DIAGNOSIS — Z01419 Encounter for gynecological examination (general) (routine) without abnormal findings: Secondary | ICD-10-CM | POA: Diagnosis not present

## 2016-02-13 DIAGNOSIS — Z1231 Encounter for screening mammogram for malignant neoplasm of breast: Secondary | ICD-10-CM | POA: Diagnosis not present

## 2016-02-13 DIAGNOSIS — Z6820 Body mass index (BMI) 20.0-20.9, adult: Secondary | ICD-10-CM | POA: Diagnosis not present

## 2016-02-13 DIAGNOSIS — Z1389 Encounter for screening for other disorder: Secondary | ICD-10-CM | POA: Diagnosis not present

## 2016-02-13 DIAGNOSIS — Z130 Encounter for screening for diseases of the blood and blood-forming organs and certain disorders involving the immune mechanism: Secondary | ICD-10-CM | POA: Diagnosis not present

## 2016-03-14 DIAGNOSIS — H6690 Otitis media, unspecified, unspecified ear: Secondary | ICD-10-CM | POA: Diagnosis not present

## 2016-03-14 DIAGNOSIS — R509 Fever, unspecified: Secondary | ICD-10-CM | POA: Diagnosis not present

## 2016-03-20 ENCOUNTER — Telehealth: Payer: 59 | Admitting: Family

## 2016-03-20 DIAGNOSIS — R059 Cough, unspecified: Secondary | ICD-10-CM

## 2016-03-20 DIAGNOSIS — J309 Allergic rhinitis, unspecified: Secondary | ICD-10-CM

## 2016-03-20 DIAGNOSIS — R05 Cough: Secondary | ICD-10-CM | POA: Diagnosis not present

## 2016-03-20 MED ORDER — BENZONATATE 200 MG PO CAPS: 200.0000 mg | ORAL_CAPSULE | Freq: Two times a day (BID) | ORAL | 0 refills | Status: DC | PRN

## 2016-03-20 MED ORDER — FLUTICASONE PROPIONATE 50 MCG/ACT NA SUSP: 2.0000 | Freq: Every day | NASAL | 6 refills | Status: DC

## 2016-03-20 NOTE — Progress Notes (Signed)
We are sorry that you are not feeling well.  Here is how we plan to help!  Based on what you have shared with me it looks like you have upper respiratory tract inflammation that has resulted in a significant cough.  Inflammation and infection in the upper respiratory tract is commonly called bronchitis and has four common causes:  Allergies, Viral Infections, Acid Reflux and Bacterial Infections.  Allergies, viruses and acid reflux are treated by controlling symptoms or eliminating the cause. An example might be a cough caused by taking certain blood pressure medications. You stop the cough by changing the medication. Another example might be a cough caused by acid reflux. Controlling the reflux helps control the cough.  Based on your presentation I believe you most likely have A cough due to allergies.  I recommend that you start the an over-the counter-allergy medication such as Claritin 10 mg or Zyrtec 10 mg daily.  I have sent in a prescription of Flonase nasal spray. Use two sprays in each nostrils at bedtime.    In addition you may use A non-prescription cough medication called Robitussin DAC. Take 2 teaspoons every 8 hours or Delsym: take 2 teaspoons every 12 hours., A non-prescription cough medication called Mucinex DM: take 2 tablets every 12 hours. and A prescription cough medication called Tessalon Perles 100mg . You may take 1-2 capsules every 8 hours as needed for your cough.    HOME CARE . Only take medications as instructed by your medical team. . Complete the entire course of an antibiotic. . Drink plenty of fluids and get plenty of rest. . Avoid close contacts especially the very young and the elderly . Cover your mouth if you cough or cough into your sleeve. . Always remember to wash your hands . A steam or ultrasonic humidifier can help congestion.    GET HELP RIGHT AWAY IF: . You develop worsening fever. . You become short of breath . You cough up blood. . Your symptoms  persist after you have completed your treatment plan MAKE SURE YOU   Understand these instructions.  Will watch your condition.  Will get help right away if you are not doing well or get worse.  Your e-visit answers were reviewed by a board certified advanced clinical practitioner to complete your personal care plan.  Depending on the condition, your plan could have included both over the counter or prescription medications. If there is a problem please reply  once you have received a response from your provider. Your safety is important to [place].  If you have drug allergies check your prescription carefully.    You can use MyChart to ask questions about today's visit, request a non-urgent call back, or ask for a work or school excuse for 24 hours related to this e-Visit. If it has been greater than 24 hours you will need to follow up with your provider, or enter a new e-Visit to address those concerns. You will get an e-mail in the next two days asking about your experience.  I hope that your e-visit has been valuable and will speed your recovery. Thank you for using e-visits.

## 2016-06-09 DIAGNOSIS — D2262 Melanocytic nevi of left upper limb, including shoulder: Secondary | ICD-10-CM | POA: Diagnosis not present

## 2016-06-09 DIAGNOSIS — Z23 Encounter for immunization: Secondary | ICD-10-CM | POA: Diagnosis not present

## 2016-06-09 DIAGNOSIS — D1800 Hemangioma unspecified site: Secondary | ICD-10-CM | POA: Diagnosis not present

## 2016-06-09 DIAGNOSIS — Z86018 Personal history of other benign neoplasm: Secondary | ICD-10-CM | POA: Diagnosis not present

## 2016-06-09 DIAGNOSIS — D225 Melanocytic nevi of trunk: Secondary | ICD-10-CM | POA: Diagnosis not present

## 2016-06-09 DIAGNOSIS — L814 Other melanin hyperpigmentation: Secondary | ICD-10-CM | POA: Diagnosis not present

## 2016-11-12 DIAGNOSIS — Z411 Encounter for cosmetic surgery: Secondary | ICD-10-CM | POA: Diagnosis not present

## 2016-11-12 DIAGNOSIS — L720 Epidermal cyst: Secondary | ICD-10-CM | POA: Diagnosis not present

## 2016-11-12 DIAGNOSIS — L570 Actinic keratosis: Secondary | ICD-10-CM | POA: Diagnosis not present

## 2016-11-12 DIAGNOSIS — D2261 Melanocytic nevi of right upper limb, including shoulder: Secondary | ICD-10-CM | POA: Diagnosis not present

## 2017-03-15 DIAGNOSIS — Z1231 Encounter for screening mammogram for malignant neoplasm of breast: Secondary | ICD-10-CM | POA: Diagnosis not present

## 2017-04-14 DIAGNOSIS — Z01419 Encounter for gynecological examination (general) (routine) without abnormal findings: Secondary | ICD-10-CM | POA: Diagnosis not present

## 2017-04-14 DIAGNOSIS — Z130 Encounter for screening for diseases of the blood and blood-forming organs and certain disorders involving the immune mechanism: Secondary | ICD-10-CM | POA: Diagnosis not present

## 2017-04-14 DIAGNOSIS — Z1389 Encounter for screening for other disorder: Secondary | ICD-10-CM | POA: Diagnosis not present

## 2017-12-23 ENCOUNTER — Other Ambulatory Visit: Payer: Self-pay | Admitting: Physician Assistant

## 2017-12-23 DIAGNOSIS — D239 Other benign neoplasm of skin, unspecified: Secondary | ICD-10-CM | POA: Diagnosis not present

## 2017-12-23 DIAGNOSIS — L570 Actinic keratosis: Secondary | ICD-10-CM | POA: Diagnosis not present

## 2017-12-23 DIAGNOSIS — D2262 Melanocytic nevi of left upper limb, including shoulder: Secondary | ICD-10-CM | POA: Diagnosis not present

## 2018-03-14 DIAGNOSIS — Z1231 Encounter for screening mammogram for malignant neoplasm of breast: Secondary | ICD-10-CM | POA: Diagnosis not present

## 2018-03-14 LAB — HM MAMMOGRAPHY

## 2018-04-28 DIAGNOSIS — Z124 Encounter for screening for malignant neoplasm of cervix: Secondary | ICD-10-CM | POA: Diagnosis not present

## 2018-04-28 DIAGNOSIS — Z01419 Encounter for gynecological examination (general) (routine) without abnormal findings: Secondary | ICD-10-CM | POA: Diagnosis not present

## 2018-04-28 DIAGNOSIS — Z0000 Encounter for general adult medical examination without abnormal findings: Secondary | ICD-10-CM | POA: Diagnosis not present

## 2018-04-28 DIAGNOSIS — Z1389 Encounter for screening for other disorder: Secondary | ICD-10-CM | POA: Diagnosis not present

## 2018-04-28 DIAGNOSIS — Z6821 Body mass index (BMI) 21.0-21.9, adult: Secondary | ICD-10-CM | POA: Diagnosis not present

## 2018-04-28 LAB — BASIC METABOLIC PANEL
BUN: 11 (ref 4–21)
Glucose: 92
Sodium: 139 (ref 137–147)

## 2018-04-28 LAB — CBC AND DIFFERENTIAL: WBC: 6.3

## 2018-04-28 LAB — TSH: TSH: 1.62 (ref <=5.90)

## 2018-04-28 LAB — HM PAP SMEAR: HM Pap smear: NORMAL

## 2018-10-19 ENCOUNTER — Ambulatory Visit (INDEPENDENT_AMBULATORY_CARE_PROVIDER_SITE_OTHER): Payer: No Typology Code available for payment source | Admitting: Family Medicine

## 2018-10-19 ENCOUNTER — Other Ambulatory Visit: Payer: Self-pay

## 2018-10-19 ENCOUNTER — Encounter: Payer: Self-pay | Admitting: Family Medicine

## 2018-10-19 DIAGNOSIS — Z124 Encounter for screening for malignant neoplasm of cervix: Secondary | ICD-10-CM | POA: Diagnosis not present

## 2018-10-19 DIAGNOSIS — L237 Allergic contact dermatitis due to plants, except food: Secondary | ICD-10-CM

## 2018-10-19 DIAGNOSIS — Z7689 Persons encountering health services in other specified circumstances: Secondary | ICD-10-CM

## 2018-10-19 DIAGNOSIS — L819 Disorder of pigmentation, unspecified: Secondary | ICD-10-CM

## 2018-10-19 MED ORDER — PREDNISONE 20 MG PO TABS: ORAL_TABLET | ORAL | 0 refills | Status: DC

## 2018-10-19 MED ORDER — TRIAMCINOLONE ACETONIDE 0.1 % EX CREA: 1.0000 "application " | TOPICAL_CREAM | Freq: Two times a day (BID) | CUTANEOUS | 0 refills | Status: DC

## 2018-10-19 MED FILL — TRIAMCINOLONE 0.1% CREAM: 0.1 | 10 days supply | Qty: 30 | Fill #0

## 2018-10-19 MED FILL — predniSONE 20 MG TABS: 20 | 10 days supply | Qty: 18 | Fill #0

## 2018-10-19 NOTE — Patient Instructions (Addendum)
It was a pleasure to meet you today.   Try the soap we discussed and other OTC care.  Use the steroid cream- try not to use on your face > 1 week.  I called in the steroid taper for you to use if felt needed.  Make physical appt in 8 weeks.   I placed those referral for you to dermatology and GYN  Stay safe and well.    Poison Ivy Dermatitis  Poison ivy dermatitis is redness and soreness (inflammation) of the skin. It is caused by a chemical that is found on the leaves of the poison ivy plant. You may also have itching, a rash, and blisters. Symptoms often clear up in 1-2 weeks. You may get this condition by touching a poison ivy plant. You can also get it by touching something that has the chemical on it. This may include animals or objects that have come in contact with the plant. Follow these instructions at home: General instructions  Take or apply over-the-counter and prescription medicines only as told by your doctor.  If you touch poison ivy, wash your skin with soap and cold water right away.  Use hydrocortisone creams or calamine lotion as needed to help with itching.  Take oatmeal baths as needed. Use colloidal oatmeal. You can get this at a pharmacy or grocery store. Follow the instructions on the package.  Do not scratch or rub your skin.  While you have the rash, wash your clothes right after you wear them. Prevention   Know what poison ivy looks like so you can avoid it. This plant has three leaves with flowering branches on a single stem. The leaves are glossy. They have uneven edges that come to a point at the front.  If you have touched poison ivy, wash with soap and water right away. Be sure to wash under your fingernails.  When hiking or camping, wear long pants, a long-sleeved shirt, tall socks, and hiking boots. You can also use a lotion on your skin that helps to prevent contact with the chemical on the plant.  If you think that your clothes or outdoor  gear came in contact with poison ivy, rinse them off with a garden hose before you bring them inside your house. Contact a doctor if:  You have open sores in the rash area.  You have more redness, swelling, or pain in the affected area.  You have redness that spreads beyond the rash area.  You have fluid, blood, or pus coming from the affected area.  You have a fever.  You have a rash over a large area of your body.  You have a rash on your eyes, mouth, or genitals.  Your rash does not get better after a few days. Get help right away if:  Your face swells or your eyes swell shut.  You have trouble breathing.  You have trouble swallowing. This information is not intended to replace advice given to you by your health care provider. Make sure you discuss any questions you have with your health care provider. Document Released: 08/08/2010 Document Revised: 03/30/2018 Document Reviewed: 12/12/2014 Elsevier Interactive Patient Education  2019 [employer].   Please help [place] help you:  We are honored you have chosen [facility] for your [facility] home. Below you will find basic instructions that you may need to access in the future. Please help [place] help you by reading the instructions, which cover many of the frequent questions we experience.  Prescription refills and request:  -In order to allow more efficient response time, please call your pharmacy for all refills. They will forward the request electronically to [place]. This allows for the quickest possible response. Request left on a nurse line can take longer to refill, since these are checked as time allows between office patients and other phone calls.  - refill request can take up to 3-5 working days to complete.  - If request is sent electronically and request is appropiate, it is usually completed in 1-2 business days.  - all patients will need to be seen routinely for all chronic medical conditions requiring prescription  medications (see follow-up below). If you are overdue for follow up on your condition, you will be asked to make an appointment and we will call in enough medication to cover you until your appointment (up to 30 days).  - all controlled substances will require a face to face visit to request/refill.  - if you desire your prescriptions to go through a new pharmacy, and have an active script at original pharmacy, you will need to call your pharmacy and have scripts transferred to new pharmacy. This is completed between the pharmacy locations and not by your provider.    Results: If any images or labs were ordered, it can take up to 1 week to get results depending on the test ordered and the lab/facility running and resulting the test. - Normal or stable results, which do not need further discussion, may be released to your mychart immediately with attached note to you. A call may not be generated for normal results. Please make certain to sign up for mychart. If you have questions on how to activate your mychart you can call the front office.  - If your results need further discussion, our office will attempt to contact you via phone, and if unable to reach you after 2 attempts, we will release your abnormal result to your mychart with instructions.  - All results will be automatically released in mychart after 1 week.  - Your provider will provide you with explanation and instruction on all relevant material in your results. Please keep in mind, results and labs may appear confusing or abnormal to the untrained eye, but it does not mean they are actually abnormal for you personally. If you have any questions about your results that are not covered, or you desire more detailed explanation than what was provided, you should make an appointment with your provider to do so.   Our office handles many outgoing and incoming calls daily. If we have not contacted you within 1 week about your results, please check  your mychart to see if there is a message first and if not, then contact our office.  In helping with this matter, you help decrease call volume, and therefore allow [place] to be able to respond to patients needs more efficiently.   Acute office visits (sick visit):  An acute visit is intended for a new problem and are scheduled in shorter time slots to allow schedule openings for patients with new problems. This is the appropriate visit to discuss a new problem. Problems will not be addressed by phone call or Echart message. Appointment is needed if requesting treatment. In order to provide you with excellent quality medical care with proper time for you to explain your problem, have an exam and receive treatment with instructions, these appointments should be limited to one new problem per visit. If you experience a new  problem, in which you desire to be addressed, please make an acute office visit, we save openings on the schedule to accommodate you. Please do not save your new problem for any other type of visit, let us take care of it properly and quickly for you.   Follow up visits:  Depending on your condition(s) your provider will need to see you routinely in order to provide you with quality care and prescribe medication(s). Most chronic conditions (Example: hypertension, Diabetes, depression/anxiety... etc), require visits a couple times a year. Your provider will instruct you on proper follow up for your personal medical conditions and history. Please make certain to make follow up appointments for your condition as instructed. Failing to do so could result in lapse in your medication treatment/refills. If you request a refill, and are overdue to be seen on a condition, we will always provide you with a 30 day script (once) to allow you time to schedule.    Medicare wellness (well visit): - we have a wonderful Nurse Maudie Mercury), that will meet with you and provide you will yearly medicare wellness visits.  These visits should occur yearly (can not be scheduled less than 1 calendar year apart) and cover preventive health, immunizations, advance directives and screenings you are entitled to yearly through your medicare benefits. Do not miss out on your entitled benefits, this is when medicare will pay for these benefits to be ordered for you.  These are strongly encouraged by your provider and is the appropriate type of visit to make certain you are up to date with all preventive health benefits. If you have not had your medicare wellness exam in the last 12 months, please make certain to schedule one by calling the office and schedule your medicare wellness with Maudie Mercury as soon as possible.   Yearly physical (well visit):  - Adults are recommended to be seen yearly for physicals. Check with your insurance and date of your last physical, most insurances require one calendar year between physicals. Physicals include all preventive health topics, screenings, medical exam and labs that are appropriate for gender/age and history. You may have fasting labs needed at this visit. This is a well visit (not a sick visit), new problems should not be covered during this visit (see acute visit).  - Pediatric patients are seen more frequently when they are younger. Your provider will advise you on well child visit timing that is appropriate for your their age. - This is not a medicare wellness visit. Medicare wellness exams do not have an exam portion to the visit. Some medicare companies allow for a physical, some do not allow a yearly physical. If your medicare allows a yearly physical you can schedule the medicare wellness with our nurse Maudie Mercury and have your physical with your provider after, on the same day. Please check with insurance for your full benefits.   Late Policy/No Shows:  - all new patients should arrive 15-30 minutes earlier than appointment to allow [place] time  to  obtain all personal demographics,  insurance  information and for you to complete office paperwork. - All established patients should arrive 10-15 minutes earlier than appointment time to update all information and be checked in .  - In our best efforts to run on time, if you are late for your appointment you will be asked to either reschedule or if able, we will work you back into the schedule. There will be a wait time to work you back in the schedule,  depending on availability.  - If you are unable to make it to your appointment as scheduled, please call 24 hours ahead of time to allow [place] to fill the time slot with someone else who needs to be seen. If you do not cancel your appointment ahead of time, you may be charged a no show fee.

## 2018-10-19 NOTE — Progress Notes (Signed)
Virtual Visit via Video   I connected with Cheryl Buck on 10/19/18 at 10:30 AM EDT by a video enabled telemedicine application and verified that I am speaking with the correct person using two identifiers. Location patient: Home Location provider: [facility], Office Persons participating in the virtual visit: Patient, Cheryl. Raoul Pitch, R.Baker, LPN  I discussed the limitations of evaluation and management by telemedicine and the availability of in person appointments. The patient expressed understanding and agreed to proceed.  Subjective:   Patient Care Team    Relationship Specialty Notifications Start End  Cheryl Hillock, DO PCP - General Family Medicine  10/19/18   Cheryl Compton, MD Consulting Physician Obstetrics and Gynecology  10/19/18   Cheryl Bookbinder, MD Consulting Physician General Surgery  10/19/18   Cheryl Buck Physician Assistant Dermatology  10/19/18    Comment: [facility]     Patient is seen today via virtual visit for new patient establishment with an acute problem. Chief Complaint  Patient presents with  . Establish Care    Prior PCP Cheryl Buck Cheryl Buck who does yearly CPE. On Focus Buck and need new referral for Cheryl Buck and to [facility]   . Poison Oak    Head to toe x1 day. Pt is concerned because it has moved to face. Itching.     HPI: Cheryl Buck is a 43 y.o. female pt seen today to establish care and discuss rash. She reports yesterday she broke out in a rash located from her head to her toe.  Patient has concerns because it has moved towards her face today.  She endorses itching.  She reports she has had poison ivy in the past and is usually required a steroid taper.  She had been out in the yard doing yard work over the weekend.  She denies signs of superficial infection, drainage, mild swelling or shortness of breath.  She also requires a dermatology referral for her skin lesions.  She has been  established at [facility] with Cheryl Buck reports she had an atypical lesion/positive margins and needs to return.  She also is established with her gynecologist Cheryl Buck. ROS: See pertinent positives and negatives per HPI.  Patient Active Problem List   Diagnosis Date Noted  . Arm mass 06/27/2012    Social History   Tobacco Use  . Smoking status: Never Smoker  . Smokeless tobacco: Never Used  Substance Use Topics  . Alcohol use: Yes    Alcohol/week: 1.0 standard drinks    Types: 1 Glasses of wine per week    Comment: A glass of wine socially     Current Outpatient Medications:  .  amphetamine-dextroamphetamine (ADDERALL XR) 15 MG 24 hr capsule, Take 15 mg by mouth every morning. PRN as needed, Disp: , Rfl:  .  predniSONE (DELTASONE) 20 MG tablet, 60 mg x3d, 40 mg x3d, 20 mg x2d, 10 mg x2d, Disp: 18 tablet, Rfl: 0 .  triamcinolone cream (KENALOG) 0.1 %, Apply 1 application topically 2 (two) times daily., Disp: 30 g, Rfl: 0  Allergies  Allergen Reactions  . 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 Coconut Fatty Acids Swelling  . Pineapple Swelling    Objective:   Gen: No acute distress. Nontoxic in appearance.  HENT: AT. [place].  MMM.  Eyes: Conjunctiva without redness, discharge or icterus. Chest: No cough or shortness of breath appreciated. Skin:  no purpura or petechiae.  Red base small vesicular blisters over forehead, cheeks and above lip.  Rash also located on trunk and extremities. Neuro: Alert. Oriented x3  Psych: Normal affect, dress and demeanor. Normal speech. Normal thought content and judgment.  Assessment and Buck:  Cheryl Buck is a 43 y.o. female present for establishment Poison ivy: Patient has a moderate case of poison ivy by exam,  areas on her face have started today.  Discussed different options with her today.  The COVID-19 steroids is the normal course of action.  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 - prednisone taper x10 days prescribed for her.  Agree steroids may be risky given her COVID-19 risks at work. -Trial of over-the-counter soaps, anti-itch creams gels and prescribed Kenalog cream.  Visor to use sparingly on her face and only for short duration of time. -She decides she wants or needs the prednisone taper it has been prescribed for her, may need to consider time off at least while on prednisone?  That would be reasonable. Follow-up over-the-counter and steroid cream with back up on prednisone if she feels it is needed. Routine Papanicolaou smear Focus Buck needs GYN referral yearly, she is established with Cheryl Buck. - Ambulatory referral to Gynecology Pigmented skin lesions Focus Buck she has established with [facility] [place] [facility] - Ambulatory referral to Dermatology  Urged patient to schedule her physical in 8 weeks  Greater than 30 minutes spent with patient, >50% of time spent face to face counseling     Cheryl Pouch, DO 10/19/2018

## 2018-10-27 ENCOUNTER — Ambulatory Visit: Payer: 59 | Admitting: Family Medicine

## 2019-03-13 ENCOUNTER — Other Ambulatory Visit: Payer: Self-pay

## 2019-03-13 ENCOUNTER — Ambulatory Visit (INDEPENDENT_AMBULATORY_CARE_PROVIDER_SITE_OTHER): Payer: No Typology Code available for payment source | Admitting: Family Medicine

## 2019-03-13 ENCOUNTER — Encounter: Payer: Self-pay | Admitting: Family Medicine

## 2019-03-13 VITALS — BP 97/58 | HR 64 | Temp 98.6°F | Resp 17 | Ht 66.73 in | Wt 148.0 lb

## 2019-03-13 DIAGNOSIS — R635 Abnormal weight gain: Secondary | ICD-10-CM

## 2019-03-13 DIAGNOSIS — Z130 Encounter for screening for diseases of the blood and blood-forming organs and certain disorders involving the immune mechanism: Secondary | ICD-10-CM

## 2019-03-13 DIAGNOSIS — Z13220 Encounter for screening for lipoid disorders: Secondary | ICD-10-CM | POA: Diagnosis not present

## 2019-03-13 DIAGNOSIS — Z131 Encounter for screening for diabetes mellitus: Secondary | ICD-10-CM

## 2019-03-13 DIAGNOSIS — G4726 Circadian rhythm sleep disorder, shift work type: Secondary | ICD-10-CM | POA: Insufficient documentation

## 2019-03-13 DIAGNOSIS — R002 Palpitations: Secondary | ICD-10-CM | POA: Diagnosis not present

## 2019-03-13 DIAGNOSIS — Z0000 Encounter for general adult medical examination without abnormal findings: Secondary | ICD-10-CM

## 2019-03-13 DIAGNOSIS — Z0001 Encounter for general adult medical examination with abnormal findings: Secondary | ICD-10-CM | POA: Diagnosis not present

## 2019-03-13 LAB — CBC WITH DIFFERENTIAL/PLATELET
Basophils Absolute: 0 10*3/uL (ref 0.0–0.1)
Basophils Relative: 0.4 % (ref 0.0–3.0)
Eosinophils Absolute: 0.1 10*3/uL (ref 0.0–0.7)
Eosinophils Relative: 0.8 % (ref 0.0–5.0)
HCT: 40.4 % (ref 36.0–46.0)
Hemoglobin: 13.3 g/dL (ref 12.0–15.0)
Lymphocytes Relative: 21.1 % (ref 12.0–46.0)
Lymphs Abs: 1.8 10*3/uL (ref 0.7–4.0)
MCHC: 32.8 g/dL (ref 30.0–36.0)
MCV: 94.2 fl (ref 78.0–100.0)
Monocytes Absolute: 0.6 10*3/uL (ref 0.1–1.0)
Monocytes Relative: 6.9 % (ref 3.0–12.0)
Neutro Abs: 6.1 10*3/uL (ref 1.4–7.7)
Neutrophils Relative %: 70.8 % (ref 43.0–77.0)
Platelets: 214 10*3/uL (ref 150.0–400.0)
RBC: 4.29 Mil/uL (ref 3.87–5.11)
RDW: 13.4 % (ref 11.5–15.5)
WBC: 8.6 10*3/uL (ref 4.0–10.5)

## 2019-03-13 LAB — COMPREHENSIVE METABOLIC PANEL
ALT: 16 U/L (ref 0–35)
AST: 21 U/L (ref 0–37)
Albumin: 4.8 g/dL (ref 3.5–5.2)
Alkaline Phosphatase: 46 U/L (ref 39–117)
BUN: 10 mg/dL (ref 6–23)
CO2: 24 mEq/L (ref 19–32)
Calcium: 9.2 mg/dL (ref 8.4–10.5)
Chloride: 102 mEq/L (ref 96–112)
Creatinine, Ser: 0.74 mg/dL (ref 0.40–1.20)
GFR: 85.36 mL/min (ref >60.00)
Glucose, Bld: 78 mg/dL (ref 70–99)
Potassium: 4.2 mEq/L (ref 3.5–5.1)
Sodium: 137 mEq/L (ref 135–145)
Total Bilirubin: 0.5 mg/dL (ref 0.2–1.2)
Total Protein: 7.2 g/dL (ref 6.0–8.3)

## 2019-03-13 LAB — LIPID PANEL
Cholesterol: 179 mg/dL (ref 0–200)
HDL: 59.8 mg/dL (ref >39.00)
LDL Cholesterol: 104 mg/dL — ABNORMAL HIGH (ref 0–99)
NonHDL: 119.42
Total CHOL/HDL Ratio: 3
Triglycerides: 77 mg/dL (ref 0.0–149.0)
VLDL: 15.4 mg/dL (ref 0.0–40.0)

## 2019-03-13 LAB — HEMOGLOBIN A1C: Hgb A1c MFr Bld: 5.3 % (ref 4.6–6.5)

## 2019-03-13 MED ORDER — AMPHETAMINE-DEXTROAMPHETAMINE 7.5 MG PO TABS: 7.5000 mg | ORAL_TABLET | Freq: Every day | ORAL | 0 refills | Status: AC | PRN

## 2019-03-13 MED ORDER — AMPHETAMINE-DEXTROAMPHETAMINE 7.5 MG PO TABS: 7.5000 mg | ORAL_TABLET | Freq: Every day | ORAL | 0 refills | Status: DC | PRN

## 2019-03-13 MED FILL — AMPHETAMINE SALTS 7.5 MG TA: 7.5 | 45 days supply | Qty: 45 | Fill #0

## 2019-03-13 NOTE — Patient Instructions (Signed)
Health Maintenance, Female Adopting a healthy lifestyle and getting preventive care are important in promoting health and wellness. Ask your health care provider about:  The right schedule for you to have regular tests and exams.  Things you can do on your own to prevent diseases and keep yourself healthy. What should I know about diet, weight, and exercise? Eat a healthy diet   Eat a diet that includes plenty of vegetables, fruits, low-fat dairy products, and lean protein.  Do not eat a lot of foods that are high in solid fats, added sugars, or sodium. Maintain a healthy weight Body mass index (BMI) is used to identify weight problems. It estimates body fat based on height and weight. Your health care provider can help determine your BMI and help you achieve or maintain a healthy weight. Get regular exercise Get regular exercise. This is one of the most important things you can do for your health. Most adults should:  Exercise for at least 150 minutes each week. The exercise should increase your heart rate and make you sweat (moderate-intensity exercise).  Do strengthening exercises at least twice a week. This is in addition to the moderate-intensity exercise.  Spend less time sitting. Even light physical activity can be beneficial. Watch cholesterol and blood lipids Have your blood tested for lipids and cholesterol at 44 years of age, then have this test every 5 years. Have your cholesterol levels checked more often if:  Your lipid or cholesterol levels are high.  You are older than 44 years of age.  You are at high risk for heart disease. What should I know about cancer screening? Depending on your health history and family history, you may need to have cancer screening at various ages. This may include screening for:  Breast cancer.  Cervical cancer.  Colorectal cancer.  Skin cancer.  Lung cancer. What should I know about heart disease, diabetes, and high blood  pressure? Blood pressure and heart disease  High blood pressure causes heart disease and increases the risk of stroke. This is more likely to develop in people who have high blood pressure readings, are of African descent, or are overweight.  Have your blood pressure checked: ? Every 3-5 years if you are 18-39 years of age. ? Every year if you are 40 years old or older. Diabetes Have regular diabetes screenings. This checks your fasting blood sugar level. Have the screening done:  Once every three years after age 40 if you are at a normal weight and have a low risk for diabetes.  More often and at a younger age if you are overweight or have a high risk for diabetes. What should I know about preventing infection? Hepatitis B If you have a higher risk for hepatitis B, you should be screened for this virus. Talk with your health care provider to find out if you are at risk for hepatitis B infection. Hepatitis C Testing is recommended for:  Everyone born from 1945 through 1965.  Anyone with known risk factors for hepatitis C. Sexually transmitted infections (STIs)  Get screened for STIs, including gonorrhea and chlamydia, if: ? You are sexually active and are younger than 44 years of age. ? You are older than 44 years of age and your health care provider tells you that you are at risk for this type of infection. ? Your sexual activity has changed since you were last screened, and you are at increased risk for chlamydia or gonorrhea. Ask your health care provider if   you are at risk.  Ask your health care provider about whether you are at high risk for HIV. Your health care provider may recommend a prescription medicine to help prevent HIV infection. If you choose to take medicine to prevent HIV, you should first get tested for HIV. You should then be tested every 3 months for as long as you are taking the medicine. Pregnancy  If you are about to stop having your period (premenopausal) and  you may become pregnant, seek counseling before you get pregnant.  Take 400 to 800 micrograms (mcg) of folic acid every day if you become pregnant.  Ask for birth control (contraception) if you want to prevent pregnancy. Osteoporosis and menopause Osteoporosis is a disease in which the bones lose minerals and strength with aging. This can result in bone fractures. If you are 65 years old or older, or if you are at risk for osteoporosis and fractures, ask your health care provider if you should:  Be screened for bone loss.  Take a calcium or vitamin D supplement to lower your risk of fractures.  Be given hormone replacement therapy (HRT) to treat symptoms of menopause. Follow these instructions at home: Lifestyle  Do not use any products that contain nicotine or tobacco, such as cigarettes, e-cigarettes, and chewing tobacco. If you need help quitting, ask your health care provider.  Do not use street drugs.  Do not share needles.  Ask your health care provider for help if you need support or information about quitting drugs. Alcohol use  Do not drink alcohol if: ? Your health care provider tells you not to drink. ? You are pregnant, may be pregnant, or are planning to become pregnant.  If you drink alcohol: ? Limit how much you use to 0-1 drink a day. ? Limit intake if you are breastfeeding.  Be aware of how much alcohol is in your drink. In the [place] one drink equals one 12 oz bottle of beer (355 mL), one 5 oz glass of wine (148 mL), or one 1 oz glass of hard liquor (44 mL). General instructions  Schedule regular health, dental, and eye exams.  Stay current with your vaccines.  Tell your health care provider if: ? You often feel depressed. ? You have ever been abused or do not feel safe at home. Summary  Adopting a healthy lifestyle and getting preventive care are important in promoting health and wellness.  Follow your health care provider's instructions about healthy  diet, exercising, and getting tested or screened for diseases.  Follow your health care provider's instructions on monitoring your cholesterol and blood pressure. This information is not intended to replace advice given to you by your health care provider. Make sure you discuss any questions you have with your health care provider. Document Released: 01/19/2011 Document Revised: 06/29/2018 Document Reviewed: 06/29/2018 Elsevier Patient Education  2020 [employer]  

## 2019-03-13 NOTE — Progress Notes (Signed)
Patient ID: Cheryl Buck, female  DOB: 09-15-1975, 43 y.o.   MRN: [id number] Patient Care Team    Relationship Specialty Notifications Start End  Ma Hillock, DO PCP - General Family Medicine  10/19/18   Paula Compton, MD Consulting Physician Obstetrics and Gynecology  10/19/18   Rolm Bookbinder, MD Consulting Physician General Surgery  10/19/18   Starlyn Skeans Physician Assistant Dermatology  10/19/18    Comment: [facility]     Chief Complaint  Patient presents with  . Annual Exam    fasting.     Subjective:  Cheryl Buck is a 43 y.o.  Female  present for CPE . All past medical history, surgical history, allergies, family history, immunizations, medications and social history were updated in the electronic medical record today. All recent labs, ED visits and hospitalizations within the last year were reviewed.  Shift work sleep disturbance/fatigue/weight gain: Patient reports she has been prescribed Adderall 15 mg XL for quite a few years secondary to "shift work sleep disturbance.  "She states she was initially prescribed these secondary to not being able to stay awake and focus during her shifts.  She reports she has been hanging onto her prescription and has been using very sparingly.  She reports taking half a capsule, which would equal 7.5 mg daily approximately-about 2-3 times a week. Indication for controlled substance: shift work sleep disturbance. Medication and dose: Adderall 7.5 mg QD PRN (uses about 3x a week) # pills per month: 15 Last UDS date: collected today Pain contract signed (Y/N): y Date narcotic database last reviewed (include red flags): 03/13/19   Health maintenance:  Colonoscopy: uncertain Fhx, routine screen at 54.  Mammogram: completed:2019, Has completed at GYN Cervical cancer screening: last pap: 2019, results: Dr. Marvel Plan.  Immunizations: tdap UTD 2014, Influenza will get at work (encouraged yearly) Infectious  disease screening: HIV completed DEXA:  N/A Assistive device: none Oxygen [id number] Patient has a Dental home. Hospitalizations/ED visits: reviewed  Depression screen [facility] 2/9 03/13/2019 08/19/2015  Decreased Interest 0 0  Down, Depressed, Hopeless 0 0  PHQ - 2 Score 0 0   No flowsheet data found.   Immunization History  Administered Date(s) Administered  . Influenza,inj,Quad PF,6+ Mos 04/07/2013  . Tdap 01/30/2013    Past Medical History:  Diagnosis Date  . Abnormal Pap smear   . Allergy   . History of excision of lesion    [place] [facility]- "atypical margins"  . Raynaud's disease    Allergies  Allergen Reactions  . 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 Coconut Fatty Acids Swelling  . Pineapple Swelling  . Diphenhydramine Palpitations   Past Surgical History:  Procedure Laterality Date  . APPENDECTOMY    . GYNECOLOGIC CRYOSURGERY    . MASS EXCISION  06/30/2012   Procedure: MINOR EXCISION OF MASS;  Surgeon: Rolm Bookbinder, MD;  Location: [facility];  Service: General;  Laterality: Left;  . WISDOM TOOTH EXTRACTION     Family History  Problem Relation Age of Onset  . Hypertension Father   . Asthma Father   . Diabetes Brother        type1  . Mental retardation Brother        half brother down's  . Hypertension Brother   . Diabetes Maternal Grandfather        type2  . Heart disease Maternal Grandfather   . Cancer Paternal Grandmother  colon and pituitary tumor  . Asthma Sister   . Hyperlipidemia Mother   . GER disease Mother   . Heart disease Maternal Grandmother   . Stroke Paternal Grandfather   . Diabetes Maternal Aunt        type2  . Asthma Daughter    Social History   Social History Narrative  . 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 If you have any questions, ask your nurse or doctor.        STOP taking these medications   amphetamine-dextroamphetamine 15 MG 24 hr capsule Commonly known as: ADDERALL XR Replaced by: amphetamine-dextroamphetamine 7.5 MG tablet Stopped by: Howard Pouch, DO   predniSONE 20 MG tablet Commonly known as: DELTASONE Stopped by: Howard Pouch, DO   triamcinolone cream 0.1 % Commonly known as: KENALOG Stopped by: Howard Pouch, DO     TAKE these medications   amphetamine-dextroamphetamine 7.5 MG tablet Commonly known as: Adderall Take 1 tablet by mouth daily as needed. Replaces: amphetamine-dextroamphetamine 15 MG 24 hr capsule Started by: Howard Pouch, DO       All past medical history, surgical history, allergies, family history, immunizations andmedications were updated in the EMR today and reviewed under the history and medication portions of their EMR.     No results found for this or any previous visit (from the past 2160 hour(s)).   No results found.  ROS: 14 pt review of systems performed and negative (unless mentioned in an HPI)  Objective: BP (!) 97/58 (BP Location: Left Arm, Patient Position: Sitting, Cuff Size: Normal)   Pulse 64   Temp 98.6 F (37 C) (Temporal)   Resp 17   Ht 5' 6.73" (1.695 m)   Wt 148 lb (67.1 kg)   LMP 02/18/2019 (Approximate)   SpO2 100%   BMI 23.37 kg/m  Gen: Afebrile. No acute distress. Nontoxic in appearance, well-developed, well-nourished, pleasant, Caucasian female. HENT: AT. [place]. Bilateral TM visualized and normal in appearance, normal external auditory canal. MMM, no oral lesions, adequate dentition. Bilateral nares within normal limits. Throat without erythema, ulcerations or exudates.  No cough on exam, no hoarseness on exam. Eyes:Pupils Equal Round Reactive to light, Extraocular movements intact,  Conjunctiva without redness,  discharge or icterus. Neck/lymp/endocrine: Supple, no lymphadenopathy, no thyromegaly CV: RRR no murmur, no edema, +2/4 P posterior tibialis pulses.  No carotid bruits. No JVD. Chest: CTAB, no wheeze, rhonchi or crackles.  Normal respiratory effort.  Good air movement. Abd: Soft.  Flat. NTND. BS present.  No masses palpated. No hepatosplenomegaly. No rebound tenderness or guarding. Skin: No rashes, purpura or petechiae. Warm and well-perfused. Skin intact. Neuro/Msk:  Normal gait. PERLA. EOMi. Alert. Oriented x3.  Cranial nerves II through XII intact. Muscle strength 5/5 upper/lower extremity. DTRs equal bilaterally. Psych: Normal affect, dress and demeanor. Normal speech. Normal thought content and judgment.   No exam data present  Assessment/plan: Cheryl Buck is a 43 y.o. female present for CPE Lipid screening - Lipid panel Diabetes mellitus screening - Hemoglobin A1c Screening for deficiency anemia - CBC with Differential/Platelet Palpitation/weight gain Infrequent palpitation, not necessarily associated with use of stimulant. She is not  routinely exercising, discussed  routine habits of exercise and dietary modifications are necessary for weight loss, especially as we age. 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  We will need to continue Adderall at 7.5 mg daily PRN for shift work sleep disorder and decreased focus.  Patient is aware that this is a controlled substance. - Pain Mgmt, Profile 8 w/Conf, U>>UDS collected today and will be collected yearly. -Controlled substance contract signed with her today. -[place] controlled substance database was reviewed. -We will need to follow-up every 6 months if wishing to continue medication.  Encounter for preventive health examination - Comprehensive metabolic panel Patient was encouraged to exercise greater than 150 minutes a week. Patient was encouraged to choose a diet filled with  fresh fruits and vegetables, and lean meats. AVS provided to patient today for education/recommendation on gender specific health and safety maintenance. Colonoscopy: uncertain Fhx, routine screen at 43 Mammogram: completed:2019, Has completed at GYN Cervical cancer screening: last pap: 2019, results: Dr. Marvel Plan.  Immunizations: tdap UTD 2014, Influenza will get at work (encouraged yearly) Infectious disease screening: HIV completed DEXA:  N/A  Return in about 1 year (around 03/12/2020) for CPE (30 min). 6 months for shift work sleep disorder  Annual physical was completed today plus an additional > 15 minutes spent with patient on acute complaints and chronic complaint requiring controlled substance, > 50% of that time face to face  Electronically signed by: Howard Pouch, [place]

## 2019-03-14 LAB — PAIN MGMT, PROFILE 8 W/CONF, U
6 Acetylmorphine: NEGATIVE ng/mL
Alcohol Metabolites: NEGATIVE ng/mL (ref <500)
Amphetamines: NEGATIVE ng/mL
Benzodiazepines: NEGATIVE ng/mL
Buprenorphine, Urine: NEGATIVE ng/mL
Cocaine Metabolite: NEGATIVE ng/mL
Creatinine: 24.3 mg/dL
MDMA: NEGATIVE ng/mL
Marijuana Metabolite: NEGATIVE ng/mL
Opiates: NEGATIVE ng/mL
Oxidant: NEGATIVE ug/mL
Oxycodone: NEGATIVE ng/mL
pH: 7.1 (ref 4.5–9.0)

## 2019-03-14 LAB — TSH: TSH: 1.19 u[IU]/mL (ref 0.35–4.50)

## 2019-03-29 ENCOUNTER — Encounter: Payer: Self-pay | Admitting: Family Medicine

## 2019-03-30 ENCOUNTER — Encounter: Payer: Self-pay | Admitting: Family Medicine

## 2020-03-06 ENCOUNTER — Ambulatory Visit: Payer: 59 | Admitting: Allergy and Immunology

## 2020-03-21 MED FILL — METHYLPREDNISOLONE 4 MG TBP: 4 | 6 days supply | Qty: 21 | Fill #0

## 2020-05-21 NOTE — Progress Notes (Signed)
[facility] [address] [place] Phone: [phone] Subjective:   I Cheryl Buck am serving as a Education administrator for Dr. Hulan Buck.  This visit occurred during the SARS-CoV-2 public health emergency.  Safety protocols were in place, including screening questions prior to the visit, additional usage of staff PPE, and extensive cleaning of exam room while observing appropriate contact time as indicated for disinfecting solutions.   I'm seeing this patient by the request  of:  Buck, Cheryl A, DO  CC: Low back pain  [id number]  Cheryl Buck is a 44 y.o. female coming in with complaint of low back pain. Patient states she is most painful on the right side. Flexion makes the pain worse as well as lifting. Sitting for long periods of time is painful. Some numbness to the toes that has gotten better. Pain since June. Patient states she feels stiff and her ROM is limited. Has tried heat, Ibuprofen and has taken medrol dose packs. 8/10 at its worse. Pain has decreased patient quality of life. Feels relief when her back pops.  Patient states that the original injury happened in June when she fell off a retaining wall of approximately a 4-1/2 drop.  Patient thinks she did have a head injury at the time as well.  Had shooting pain down the legs bilaterally at that time and has now seem to be mostly on the right side.  Has significant stiffness still in the back.       Past Medical History:  Diagnosis Date  . Abnormal Pap smear   . Allergy   . History of excision of lesion    [place] [facility]- "atypical margins"  . Raynaud's disease    Past Surgical History:  Procedure Laterality Date  . APPENDECTOMY    . GYNECOLOGIC CRYOSURGERY    . MASS EXCISION  06/30/2012   Procedure: MINOR EXCISION OF MASS;  Surgeon: Rolm Bookbinder, MD;  Location: [facility];  Service: General;  Laterality: Left;  . WISDOM TOOTH EXTRACTION     Social  History   Socioeconomic History  . Marital status: Married    Spouse name: Not on file  . Number of children: Not on file  . Years of education: Not on file  . Highest education level: Not on file  Occupational History  . Not on file  Tobacco Use  . Smoking status: Never Smoker  . Smokeless tobacco: Never Used  Vaping Use  . Vaping Use: Never used  Substance and Sexual Activity  . Alcohol use: Yes    Alcohol/week: 1.0 standard drink    Types: 1 Glasses of wine per week    Comment: A glass of wine socially   . Drug use: No  . Sexual activity: Yes  Other Topics Concern  . Not on file  Social History Narrative  . Not on file   Social Determinants of Health   Financial Resource Strain:   . Difficulty of Paying Living Expenses: Not on file  Food Insecurity:   . Worried About Charity fundraiser in the Last Year: Not on file  . Ran Out of Food in the Last Year: Not on file  Transportation Needs:   . Lack of Transportation (Medical): Not on file  . Lack of Transportation (Non-Medical): Not on file  Physical Activity:   . Days of Exercise per Week: Not on file  . Minutes of Exercise per Session: Not on file  Stress:   .  Feeling of Stress : Not on file  Social Connections:   . Frequency of Communication with Friends and Family: Not on file  . Frequency of Social Gatherings with Friends and Family: Not on file  . Attends Religious Services: Not on file  . Active Member of Clubs or Organizations: Not on file  . Attends Archivist Meetings: Not on file  . Marital Status: Not on file   Allergies  Allergen Reactions  . 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 Coconut Fatty Acids Swelling  . Pineapple Swelling  . Diphenhydramine Palpitations   Family History  Problem Relation Age of Onset  . Hypertension Father   . Asthma Father   . Diabetes Brother        type1  . Mental retardation Brother        half brother down's  .  Hypertension Brother   . Diabetes Maternal Grandfather        type2  . Heart disease Maternal Grandfather   . Cancer Paternal Grandmother        colon and pituitary tumor  . Asthma Sister   . Hyperlipidemia Mother   . GER disease Mother   . Heart disease Maternal Grandmother   . Stroke Paternal Grandfather   . Diabetes Maternal Aunt        type2  . Asthma Daughter     Current Outpatient Medications (Endocrine & Metabolic):  .  predniSONE (DELTASONE) 50 MG tablet, 1 tablet by mouth daily      Current Outpatient Medications (Other):  .  amphetamine-dextroamphetamine (ADDERALL) 7.5 MG tablet, Take 1 tablet by mouth daily as needed. .  methocarbamol (ROBAXIN) 500 MG tablet, Take 1 tablet (500 mg total) by mouth 4 (four) times daily. .  Vitamin D, Ergocalciferol, (DRISDOL) 1.25 MG (50000 UNIT) CAPS capsule, Take 1 capsule (50,000 Units total) by mouth every 7 (seven) days.   Reviewed prior external information including notes and imaging from  primary care provider As well as notes that were available from care everywhere and other healthcare systems.  Past medical history, social, surgical and family history all reviewed in electronic medical record.  No pertanent information unless stated regarding to the chief complaint.   Review of Systems:  No headache, visual changes, nausea, vomiting, diarrhea, constipation, dizziness, abdominal pain, skin rash, fevers, chills, night sweats, weight loss, swollen lymph nodes, body aches, joint swelling, chest pain, shortness of breath, mood changes. POSITIVE muscle aches  Objective  Blood pressure 110/70, pulse 78, height 5\' 6"  (1.676 m), weight 141 lb (64 kg), last menstrual period 05/15/2020, SpO2 97 %, unknown if currently breastfeeding.   General: No apparent distress alert and oriented x3 mood and affect normal, dressed appropriately.  HEENT: Pupils equal, extraocular movements intact  Respiratory: Patient's speak in full sentences  and does not appear short of breath  Cardiovascular: No lower extremity edema, non tender, no erythema  Gait normal with good balance and coordination.  MSK:   Significant loss of lordosis at this time.  Tender to palpation in the paraspinal musculature of the lumbar spine diffusely.  More pain no in the L4-L5 and L5-S1 area.  Mild increase in tightness with straight leg test on the right side compared to the left.  Negative FABER test.  Neurovascularly intact distally.  Patient does have voluntary guarding of the lumbar region..   Impression and Recommendations:     The above documentation has been reviewed  and is accurate and complete Lyndal Pulley, DO

## 2020-05-22 ENCOUNTER — Encounter: Payer: Self-pay | Admitting: Family Medicine

## 2020-05-22 ENCOUNTER — Other Ambulatory Visit: Payer: Self-pay

## 2020-05-22 ENCOUNTER — Other Ambulatory Visit: Payer: Self-pay | Admitting: Family Medicine

## 2020-05-22 ENCOUNTER — Ambulatory Visit (INDEPENDENT_AMBULATORY_CARE_PROVIDER_SITE_OTHER): Payer: Self-pay | Admitting: Family Medicine

## 2020-05-22 ENCOUNTER — Ambulatory Visit (INDEPENDENT_AMBULATORY_CARE_PROVIDER_SITE_OTHER): Payer: Self-pay

## 2020-05-22 VITALS — BP 110/70 | HR 78 | Ht 66.0 in | Wt 141.0 lb

## 2020-05-22 DIAGNOSIS — G8929 Other chronic pain: Secondary | ICD-10-CM

## 2020-05-22 DIAGNOSIS — M5416 Radiculopathy, lumbar region: Secondary | ICD-10-CM

## 2020-05-22 DIAGNOSIS — M545 Low back pain, unspecified: Secondary | ICD-10-CM

## 2020-05-22 MED ORDER — METHOCARBAMOL 500 MG PO TABS: 500.0000 mg | ORAL_TABLET | Freq: Four times a day (QID) | ORAL | 3 refills | Status: DC

## 2020-05-22 MED ORDER — VITAMIN D (ERGOCALCIFEROL) 1.25 MG (50000 UNIT) PO CAPS: 50000.0000 [IU] | ORAL_CAPSULE | ORAL | 0 refills | Status: DC

## 2020-05-22 MED ORDER — PREDNISONE 50 MG PO TABS: ORAL_TABLET | ORAL | 0 refills | Status: DC

## 2020-05-22 MED FILL — METHOCARBAMOL 500 MG TABS: 500 | 7 days supply | Qty: 30 | Fill #0

## 2020-05-22 MED FILL — predniSONE 50 MG TABS: 50 | 5 days supply | Qty: 5 | Fill #0

## 2020-05-22 MED FILL — VIT D2 1.25 MG (50,000 UNIT: 1.25 MG | 28 days supply | Qty: 4 | Fill #0

## 2020-05-22 NOTE — Patient Instructions (Signed)
Good to see you Xray today Once weekly vitamin D for 4 weeks Prednisone 50 mg daily for 5 days Robaxen 500 mg at night Exercises for the back 3 times a week See me again in 3-5 weeks

## 2020-05-22 NOTE — Assessment & Plan Note (Signed)
Patient is having significant lumbar radiculopathy.  Discussed with patient in great length.  Prednisone given, Robaxin encouraged but only to do it more at night.  Warned of potential side effects.  Patient has x-rays pending with having a acute fall 5 months ago concern for potential compression fracture but should be stabilizing.  Attempted to set patient up for osteopathic manipulation but patient has significant tightness of the back.  Home exercises given and will try icing regimen.  Worsening pain advanced imaging or formal physical therapy may be necessary.  Follow-up again in 3 to 4 weeks

## 2020-06-27 ENCOUNTER — Ambulatory Visit: Payer: Self-pay | Admitting: Family Medicine

## 2020-08-22 ENCOUNTER — Other Ambulatory Visit: Payer: Self-pay

## 2020-08-22 ENCOUNTER — Encounter: Payer: Self-pay | Admitting: Physician Assistant

## 2020-08-22 ENCOUNTER — Ambulatory Visit: Payer: 59 | Admitting: Physician Assistant

## 2020-08-22 DIAGNOSIS — Z1283 Encounter for screening for malignant neoplasm of skin: Secondary | ICD-10-CM

## 2020-08-22 DIAGNOSIS — L7211 Pilar cyst: Secondary | ICD-10-CM

## 2020-08-22 DIAGNOSIS — Z87898 Personal history of other specified conditions: Secondary | ICD-10-CM

## 2020-08-22 DIAGNOSIS — Z86018 Personal history of other benign neoplasm: Secondary | ICD-10-CM

## 2020-09-09 NOTE — Progress Notes (Signed)
   Follow-Up Visit   Subjective  Cheryl Buck is a 45 y.o. female who presents for the following: Annual Exam (Recheck scalp previous ln2).   The following portions of the chart were reviewed this encounter and updated as appropriate:      Objective  Well appearing patient in no apparent distress; mood and affect are within normal limits.  A full examination was performed including scalp, head, eyes, ears, nose, lips, neck, chest, axillae, abdomen, back, buttocks, bilateral upper extremities, bilateral lower extremities, hands, feet, fingers, toes, fingernails, and toenails. All findings within normal limits unless otherwise noted below.  Objective  Chest - Medial [facility]): Multiple white scar- clear  Objective  Left Breast: Full body skin examination- no atypical moles or non mole skin cancer  Objective  Mid Frontal Scalp: Solitary, smooth skin colored to translucent papule.    Assessment & Plan  History of atypical nevus Chest - Medial (Center)  Yearly skin check  Encounter for screening for malignant neoplasm of skin Left Breast  Yearly skin check  Pilar cyst Mid Frontal Scalp  Okay to leave if stable    I, Rhythm Wigfall, PA-C, have reviewed all documentation's for this visit.  The documentation on 09/09/20 for the exam, diagnosis, procedures and orders are all accurate and complete.

## 2020-10-31 ENCOUNTER — Other Ambulatory Visit (HOSPITAL_COMMUNITY): Payer: Self-pay | Admitting: *Deleted

## 2020-11-04 ENCOUNTER — Inpatient Hospital Stay (HOSPITAL_COMMUNITY): Admission: RE | Admit: 2020-11-04 | Payer: 59 | Source: Ambulatory Visit

## 2021-01-09 ENCOUNTER — Ambulatory Visit: Payer: 59 | Admitting: Family Medicine

## 2021-01-15 NOTE — Progress Notes (Signed)
[employer] [address] [place] Phone: [phone] Subjective:   I Cheryl Buck am serving as a Education administrator for Dr. Hulan Saas.  This visit occurred during the SARS-CoV-2 public health emergency.  Safety protocols were in place, including screening questions prior to the visit, additional usage of staff PPE, and extensive cleaning of exam room while observing appropriate contact time as indicated for disinfecting solutions.   I'm seeing this patient by the request  of:  Kuneff, Renee A, DO  CC: Right shoulder pain  [id number]  Cheryl Buck is a 45 y.o. female coming in with complaint of R shoulder pain. Seen for LBP in 2021. Patient states the pain is chronic and has been on and off. States the pain is achy all the time. Pain is in the [facility] joint. Flexion is pain as well as other ADLs. States she has neck pain on the right side. Pain radiates into the forearm sometimes. Has tried ibuprofen for pain. 5-6/10 at its worse. Can't sleep on her right side and states if she does she is in pain for days.       Past Medical History:  Diagnosis Date   Abnormal Pap smear    Allergy    Atypical mole 03/29/2012   left 2nd toe tx wider shave    Atypical mole 03/29/2012   mild-mod- right abdoemn (monitor)   Atypical mole 04/06/2013   mod-severe-left abdomen- (EXC)   Atypical mole 11/06/2013   mod-right shoulder (monitor)   Atypical mole 11/06/2013   moderate-left lateral breast (monitor)   Atypical mole 05/15/2014   mod-severe-left inner thigh (WS)   Atypical mole 05/15/2014   mod-right buttock (monitor)   Atypical mole 05/15/2014   moderate- right post knee (monitor)   Atypical mole 05/15/2014   moderate- right inner arm (moinitor)   Atypical mole 12/23/2017   moderate- left outer shoulder (0)   History of excision of lesion    [place] derm- "atypical margins"   Raynaud's disease    Past Surgical History:  Procedure Laterality  Date   APPENDECTOMY     GYNECOLOGIC CRYOSURGERY     MASS EXCISION  06/30/2012   Procedure: MINOR EXCISION OF MASS;  Surgeon: Rolm Bookbinder, MD;  Location: [facility];  Service: General;  Laterality: Left;   WISDOM TOOTH EXTRACTION     Social History   Socioeconomic History   Marital status: Married    Spouse name: Not on file   Number of children: Not on file   Years of education: Not on file   Highest education level: Not on file  Occupational History   Not on file  Tobacco Use   Smoking status: Never   Smokeless tobacco: Never  Vaping Use   Vaping Use: Never used  Substance and Sexual Activity   Alcohol use: Yes    Alcohol/week: 1.0 standard drink    Types: 1 Glasses of wine per week    Comment: A glass of wine socially    Drug use: No   Sexual activity: Yes  Other Topics Concern   Not on file  Social History Narrative   Not on file   Social Determinants of Health   Financial Resource Strain: Not on file  Food Insecurity: Not on file  Transportation Needs: Not on file  Physical Activity: Not on file  Stress: Not on file  Social Connections: Not on file   Allergies  Allergen Reactions   Benadryl [Diphenhydramine Hcl]  Races heart has passed out from it   Coconut Fatty Acids Swelling   Pineapple Swelling   Diphenhydramine Palpitations   Family History  Problem Relation Age of Onset   Hypertension Father    Asthma Father    Diabetes Brother        type1   Mental retardation Brother        half brother down's   Hypertension Brother    Diabetes Maternal Grandfather        type2   Heart disease Maternal Grandfather    Cancer Paternal Grandmother        colon and pituitary tumor   Asthma Sister    Hyperlipidemia Mother    GER disease Mother    Heart disease Maternal Grandmother    Stroke Paternal Grandfather    Diabetes Maternal Aunt        type2   Asthma Daughter     Current Outpatient Medications (Endocrine & Metabolic):     predniSONE (DELTASONE) 50 MG tablet, TAKE 1 TABLET BY MOUTH ONCE A DAY    Current Outpatient Medications (Analgesics):    meloxicam (MOBIC) 15 MG tablet, Take 1 tablet (15 mg total) by mouth daily.   Current Outpatient Medications (Other):    amphetamine-dextroamphetamine (ADDERALL) 7.5 MG tablet, Take 1 tablet by mouth daily as needed.   methocarbamol (ROBAXIN) 500 MG tablet, TAKE 1 TABLET BY MOUTH FOUR TIMES DAILY   Vitamin D, Ergocalciferol, (DRISDOL) 1.25 MG (50000 UNIT) CAPS capsule, TAKE 1 CAPSULE BY MOUTH EVERY 7 DAYS   Reviewed prior external information including notes and imaging from  primary care provider As well as notes that were available from care everywhere and other healthcare systems.  Past medical history, social, surgical and family history all reviewed in electronic medical record.  No pertanent information unless stated regarding to the chief complaint.   Review of Systems:  No headache, visual changes, nausea, vomiting, diarrhea, constipation, dizziness, abdominal pain, skin rash, fevers, chills, night sweats, weight loss, swollen lymph nodes, body aches, joint swelling, chest pain, shortness of breath, mood changes. POSITIVE muscle aches  Objective  Blood pressure 108/72, pulse 71, height 5\' 6"  (1.676 m), weight 155 lb (70.3 kg), last menstrual period 01/16/2021, SpO2 100 %, unknown if currently breastfeeding.   General: No apparent distress alert and oriented x3 mood and affect normal, dressed appropriately.  HEENT: Pupils equal, extraocular movements intact  Respiratory: Patient's speak in full sentences and does not appear short of breath  Cardiovascular: No lower extremity edema, non tender, no erythema  Gait normal with good balance and coordination.  MSK: Right shoulder exam shows the patient does have fairly good range of motion. Still lacks 5 degrees of internal rotation.  Patient does have though full flexion.  Rotator cuff strength seems to be  intact.  Positive crossover noted.  Limited muscular skeletal ultrasound was performed and interpreted by Hulan Saas, M  Limited ultrasound shows the patient does have hypoechoic changes noted over the acromioclavicular joint with some mild arthritic changes.  Patient's rotator cuff including the supraspinatus appears to be unremarkable with very mild subacromial bursitis noted.  Very mild hypoechoic changes within the tendon that could be consistent with a tendinitis in the supraspinatus and the subscapularis. 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

## 2021-01-16 ENCOUNTER — Encounter: Payer: Self-pay | Admitting: Family Medicine

## 2021-01-16 ENCOUNTER — Other Ambulatory Visit (HOSPITAL_COMMUNITY): Payer: Self-pay

## 2021-01-16 ENCOUNTER — Ambulatory Visit: Payer: Self-pay

## 2021-01-16 ENCOUNTER — Ambulatory Visit (INDEPENDENT_AMBULATORY_CARE_PROVIDER_SITE_OTHER): Payer: 59

## 2021-01-16 ENCOUNTER — Other Ambulatory Visit: Payer: Self-pay

## 2021-01-16 ENCOUNTER — Ambulatory Visit: Payer: 59 | Admitting: Family Medicine

## 2021-01-16 VITALS — BP 108/72 | HR 71 | Ht 66.0 in | Wt 155.0 lb

## 2021-01-16 DIAGNOSIS — M25511 Pain in right shoulder: Secondary | ICD-10-CM | POA: Diagnosis not present

## 2021-01-16 MED ORDER — MELOXICAM 15 MG PO TABS
15.0000 mg | ORAL_TABLET | Freq: Every day | ORAL | 0 refills | Status: DC
  Filled 2021-01-16 – 2021-01-27 (×2): qty 30, 30d supply, fill #0

## 2021-01-16 NOTE — Patient Instructions (Addendum)
GREAT to see you Meloxicam daily for 5 days then as needed Ice 20 minutes 2 times daily. Usually after activity and before bed. Keep hands within peripheral vision See me again in 5-6 weeks and if not better I will do injection

## 2021-01-17 ENCOUNTER — Encounter: Payer: Self-pay | Admitting: Family Medicine

## 2021-01-17 DIAGNOSIS — M25511 Pain in right shoulder: Secondary | ICD-10-CM | POA: Insufficient documentation

## 2021-01-17 NOTE — Assessment & Plan Note (Signed)
Right shoulder pain.  Seems to be intermittent.  Described as more of a dull, throbbing aching pain.  X-rays pending.  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  Patient would like to hold on the injection but will try topical anti-inflammatories, oral anti-inflammatories, home exercises given.  Follow-up again in 6 weeks and we will discuss the possibility of injection if necessary.  Also patient was having some mild neck pain will consider the possibility of osteopathic manipulation and follow-up

## 2021-01-23 MED FILL — Methocarbamol Tab 500 MG: ORAL | 7 days supply | Qty: 30 | Fill #0 | Status: AC

## 2021-01-24 ENCOUNTER — Other Ambulatory Visit (HOSPITAL_COMMUNITY): Payer: Self-pay

## 2021-01-27 ENCOUNTER — Other Ambulatory Visit (HOSPITAL_COMMUNITY): Payer: Self-pay

## 2021-02-26 ENCOUNTER — Ambulatory Visit: Payer: 59 | Admitting: Family Medicine

## 2021-04-10 ENCOUNTER — Ambulatory Visit: Payer: Self-pay

## 2021-04-10 ENCOUNTER — Ambulatory Visit: Payer: 59 | Admitting: Family Medicine

## 2021-04-10 ENCOUNTER — Other Ambulatory Visit: Payer: Self-pay

## 2021-04-10 VITALS — BP 100/86 | HR 77 | Ht 66.0 in | Wt 157.0 lb

## 2021-04-10 DIAGNOSIS — M25511 Pain in right shoulder: Secondary | ICD-10-CM

## 2021-04-10 DIAGNOSIS — M19011 Primary osteoarthritis, right shoulder: Secondary | ICD-10-CM | POA: Diagnosis not present

## 2021-04-10 NOTE — Progress Notes (Signed)
Cheryl Buck Phone: [phone] Subjective:   Cheryl Buck, am serving as a scribe for Dr. Hulan Saas. This visit occurred during the SARS-CoV-2 public health emergency.  Safety protocols were in place, including screening questions prior to the visit, additional usage of staff PPE, and extensive cleaning of exam room while observing appropriate contact time as indicated for disinfecting solutions.   I'm seeing this patient by the request  of:  Kuneff, Renee A, DO  CC: shoulder pain f/u   [id number]  01/16/2021 Cheryl Buck is a 45 y.o. female coming in with complaint of right shoulder pain.  Patient was seen nearly 3 months ago and was diagnosed with mild tendinitis of the rotator cuff as well as a acromioclavicular effusion with mild osteoarthritic changes.  Patient did have x-rays of the cervical spine and the right shoulder that were unremarkable.  Updated 04/10/2021 Buck change in pain since last visit. Exercises continue to bother her. Would like to try injection today.  Patient states that sometimes she can Decent days and sometimes it makes her change in range of motion and starts having increasing discomfort again.  Patient states that it is affecting her job sometimes as well as can wake her up at night.      Past Medical History:  Diagnosis Date   Abnormal Pap smear    Allergy    Atypical mole 03/29/2012   left 2nd toe tx wider shave    Atypical mole 03/29/2012   mild-mod- right abdoemn (monitor)   Atypical mole 04/06/2013   mod-severe-left abdomen- (EXC)   Atypical mole 11/06/2013   mod-right shoulder (monitor)   Atypical mole 11/06/2013   moderate-left lateral breast (monitor)   Atypical mole 05/15/2014   mod-severe-left inner thigh (WS)   Atypical mole 05/15/2014   mod-right buttock (monitor)   Atypical mole 05/15/2014   moderate- right post knee (monitor)   Atypical mole 05/15/2014    moderate- right inner arm (moinitor)   Atypical mole 12/23/2017   moderate- left outer shoulder (0)   History of excision of lesion    [place] derm- "atypical margins"   Raynaud's disease    Past Surgical History:  Procedure Laterality Date   APPENDECTOMY     GYNECOLOGIC CRYOSURGERY     MASS EXCISION  06/30/2012   Procedure: MINOR EXCISION OF MASS;  Surgeon: Rolm Bookbinder, MD;  Location: [facility];  Service: General;  Laterality: Left;   WISDOM TOOTH EXTRACTION     Social History   Socioeconomic History   Marital status: Married    Spouse name: Not on file   Number of children: Not on file   Years of education: Not on file   Highest education level: Not on file  Occupational History   Not on file  Tobacco Use   Smoking status: Never   Smokeless tobacco: Never  Vaping Use   Vaping Use: Never used  Substance and Sexual Activity   Alcohol use: Yes    Alcohol/week: 1.0 standard drink    Types: 1 Glasses of wine per week    Comment: A glass of wine socially    Drug use: Buck   Sexual activity: Yes  Other Topics Concern   Not on file  Social History Narrative   Not on file   Social Determinants of Health   Financial Resource Strain: Not on file  Food Insecurity: Not on file  Transportation Needs: Not on  file  Physical Activity: Not on file  Stress: Not on file  Social Connections: Not on file   Allergies  Allergen Reactions   Benadryl [Diphenhydramine Hcl]     Races heart has passed out from it   Coconut Fatty Acids Swelling   Pineapple Swelling   Diphenhydramine Palpitations   Family History  Problem Relation Age of Onset   Hypertension Father    Asthma Father    Diabetes Brother        type1   Mental retardation Brother        half brother down's   Hypertension Brother    Diabetes Maternal Grandfather        type2   Heart disease Maternal Grandfather    Cancer Paternal Grandmother        colon and pituitary tumor   Asthma  Sister    Hyperlipidemia Mother    GER disease Mother    Heart disease Maternal Grandmother    Stroke Paternal Grandfather    Diabetes Maternal Aunt        type2   Asthma Daughter     Current Outpatient Medications (Endocrine & Metabolic):    predniSONE (DELTASONE) 50 MG tablet, TAKE 1 TABLET BY MOUTH ONCE A DAY    Current Outpatient Medications (Analgesics):    meloxicam (MOBIC) 15 MG tablet, Take 1 tablet (15 mg total) by mouth daily.   Current Outpatient Medications (Other):    amphetamine-dextroamphetamine (ADDERALL) 7.5 MG tablet, Take 1 tablet by mouth daily as needed.   methocarbamol (ROBAXIN) 500 MG tablet, TAKE 1 TABLET BY MOUTH FOUR TIMES DAILY   Vitamin D, Ergocalciferol, (DRISDOL) 1.25 MG (50000 UNIT) CAPS capsule, TAKE 1 CAPSULE BY MOUTH EVERY 7 DAYS   Reviewed prior external information including notes and imaging from  primary care provider As well as notes that were available from care everywhere and other healthcare systems.  Past medical history, social, surgical and family history all reviewed in electronic medical record.  Buck pertanent information unless stated regarding to the chief complaint.   Review of Systems:  Buck headache, visual changes, nausea, vomiting, diarrhea, constipation, dizziness, abdominal pain, skin rash, fevers, chills, night sweats, weight loss, swollen lymph nodes, body aches, joint swelling, chest pain, shortness of breath, mood changes. POSITIVE muscle aches  Objective  Blood pressure 100/86, pulse 77, height 5\' 6"  (1.676 m), weight 157 lb (71.2 kg), SpO2 99 %, unknown if currently breastfeeding.   General: Buck apparent distress alert and oriented x3 mood and affect normal, dressed appropriately.  HEENT: Pupils equal, extraocular movements intact  Respiratory: Patient's speak in full sentences and does not appear short of breath  Cardiovascular: Buck lower extremity edema, non tender, Buck erythema  Gait normal with good balance and  coordination.  MSK: Right shoulder exam shows the patient does have impingement noted.  Patient does have positive crossover noted as well.  Mild positive O'Brien's noted as well.  Procedure: Real-time Ultrasound Guided Injection of right glenohumeral joint Device: GE Logiq Q7  Ultrasound guided injection is preferred based studies that show increased duration, increased effect, greater accuracy, decreased procedural pain, increased response rate with ultrasound guided versus blind injection.  Verbal informed consent obtained.  Time-out conducted.  Noted Buck overlying erythema, induration, or other signs of local infection.  Skin prepped in a sterile fashion.  Local anesthesia: Topical Ethyl chloride.  With sterile technique and under real time ultrasound guidance:  Joint visualized.  23g 1  inch needle inserted posterior approach. Pictures  taken for needle placement. Patient did have injection of, 2 cc of 0.5% Marcaine, and 1.0 cc of Kenalog 40 mg/dL. Completed without difficulty  Pain immediately resolved suggesting accurate placement of the medication.  Advised to call if fevers/chills, erythema, induration, drainage, or persistent bleeding.  Impression: Technically successful ultrasound guided injection.  Procedure: Real-time Ultrasound Guided Injection of right acromioclavicular joint Device: GE Logiq Q7 Ultrasound guided injection is preferred based studies that show increased duration, increased effect, greater accuracy, decreased procedural pain, increased response rate, and decreased cost with ultrasound guided versus blind injection.  Verbal informed consent obtained.  Time-out conducted.  Noted Buck overlying erythema, induration, or other signs of local infection.  Skin prepped in a sterile fashion.  Local anesthesia: Topical Ethyl chloride.  With sterile technique and under real time ultrasound guidance: With a 25-gauge half inch needle injected with 0.5 cc of 0.5% Marcaine and  0.5 cc of Kenalog 40 mg/mL. Completed without difficulty  Pain immediately resolved suggesting accurate placement of the medication.  Advised to call if fevers/chills, erythema, induration, drainage, or persistent bleeding.  Impression: Technically successful ultrasound guided injection.    Impression and Recommendations:     The above documentation has been reviewed and is accurate and complete Lyndal Pulley, DO

## 2021-04-10 NOTE — Assessment & Plan Note (Signed)
Right shoulder injection given the glenohumeral joint as well as the acromioclavicular joint.  Patient was having significant amount of effusion still noted of the acromioclavicular joint.  Patient hopefully will be willing to be doing well.  We discussed that the differential also includes labral pathology and advanced imaging would be the other thing that is warranted.  Patient wants to avoid any surgical intervention so we will hold on any advanced imaging because it would not change medical management at this time.  Patient will restart the exercises and see how she responds.  Follow-up with me again 6 weeks

## 2021-04-10 NOTE — Patient Instructions (Addendum)
2 shoulder injections today Ok to start increasing activity in next 2 days Thumbs up with lifting Keep hands in peripheral vision Send message in couple of weeks See me when you need me

## 2021-06-02 ENCOUNTER — Ambulatory Visit (AMBULATORY_SURGERY_CENTER): Payer: 59 | Admitting: *Deleted

## 2021-06-02 ENCOUNTER — Other Ambulatory Visit: Payer: Self-pay

## 2021-06-02 ENCOUNTER — Other Ambulatory Visit (HOSPITAL_COMMUNITY): Payer: Self-pay

## 2021-06-02 ENCOUNTER — Encounter: Payer: Self-pay | Admitting: Internal Medicine

## 2021-06-02 VITALS — Ht 67.0 in | Wt 150.0 lb

## 2021-06-02 DIAGNOSIS — K219 Gastro-esophageal reflux disease without esophagitis: Secondary | ICD-10-CM

## 2021-06-02 DIAGNOSIS — Z1211 Encounter for screening for malignant neoplasm of colon: Secondary | ICD-10-CM

## 2021-06-02 DIAGNOSIS — R110 Nausea: Secondary | ICD-10-CM

## 2021-06-02 MED ORDER — ONDANSETRON HCL 4 MG PO TABS
4.0000 mg | ORAL_TABLET | Freq: Three times a day (TID) | ORAL | 0 refills | Status: DC | PRN
  Filled 2021-06-02: qty 2, 1d supply, fill #0

## 2021-06-02 NOTE — Progress Notes (Signed)
Pt's previsit is done over the phone and all paperwork (prep instructions, blank consent form to just read over) sent to patient.  Pt's name and DOB verified at the beginning of the previsit.  Pt denies any difficulty with ambulating.    No trouble with anesthesia, denies being told they were difficult to intubate, or hx/fam hx of malignant hyperthermia per pt   No egg or soy allergy  No home oxygen use   No medications for weight loss taken  emmi information given  Pt denies constipation issues  Pt informed that we do not do prior authorizations for prep

## 2021-06-03 ENCOUNTER — Other Ambulatory Visit (HOSPITAL_COMMUNITY): Payer: Self-pay

## 2021-06-03 MED ORDER — SUTAB 1479-225-188 MG PO TABS
1.0000 | ORAL_TABLET | Freq: Once | ORAL | 0 refills | Status: AC
Start: 2021-06-03 — End: 2021-06-06
  Filled 2021-06-03: qty 24, 1d supply, fill #0

## 2021-06-03 NOTE — Addendum Note (Signed)
Addended by: Laverna Peace on: 06/03/2021 07:41 AM   Modules accepted: Orders

## 2021-06-09 ENCOUNTER — Ambulatory Visit (AMBULATORY_SURGERY_CENTER): Payer: 59 | Admitting: Internal Medicine

## 2021-06-09 ENCOUNTER — Other Ambulatory Visit: Payer: Self-pay | Admitting: Internal Medicine

## 2021-06-09 ENCOUNTER — Encounter: Payer: Self-pay | Admitting: Internal Medicine

## 2021-06-09 ENCOUNTER — Other Ambulatory Visit (HOSPITAL_COMMUNITY): Payer: Self-pay

## 2021-06-09 VITALS — BP 139/71 | HR 63 | Temp 98.2°F | Resp 16 | Ht 66.0 in | Wt 150.0 lb

## 2021-06-09 DIAGNOSIS — K319 Disease of stomach and duodenum, unspecified: Secondary | ICD-10-CM | POA: Diagnosis not present

## 2021-06-09 DIAGNOSIS — Z1211 Encounter for screening for malignant neoplasm of colon: Secondary | ICD-10-CM | POA: Diagnosis not present

## 2021-06-09 DIAGNOSIS — K2970 Gastritis, unspecified, without bleeding: Secondary | ICD-10-CM | POA: Diagnosis not present

## 2021-06-09 DIAGNOSIS — R12 Heartburn: Secondary | ICD-10-CM | POA: Diagnosis not present

## 2021-06-09 DIAGNOSIS — K253 Acute gastric ulcer without hemorrhage or perforation: Secondary | ICD-10-CM | POA: Diagnosis not present

## 2021-06-09 DIAGNOSIS — R1013 Epigastric pain: Secondary | ICD-10-CM

## 2021-06-09 DIAGNOSIS — K219 Gastro-esophageal reflux disease without esophagitis: Secondary | ICD-10-CM

## 2021-06-09 MED ORDER — PANTOPRAZOLE SODIUM 40 MG PO TBEC
DELAYED_RELEASE_TABLET | ORAL | 2 refills | Status: DC
Start: 2021-06-09 — End: 2022-05-28
  Filled 2021-06-09: qty 30, 30d supply, fill #0
  Filled 2021-07-08: qty 30, 30d supply, fill #1
  Filled 2021-11-10: qty 30, 30d supply, fill #2
  Filled 2022-03-26: qty 30, 30d supply, fill #3

## 2021-06-09 MED ORDER — SODIUM CHLORIDE 0.9 % IV SOLN: 500.0000 mL | Freq: Once | INTRAVENOUS | Status: DC

## 2021-06-09 NOTE — Progress Notes (Signed)
GASTROENTEROLOGY PROCEDURE H&P NOTE   Primary Care Physician: Ma Hillock, DO    Reason for Procedure:  GERD and dyspepsia, colorectal cancer screening  Plan:    EGD and colonoscopy  Patient is appropriate for endoscopic procedure(s) in the ambulatory ([facility]) setting.  The nature of the procedure, as well as the risks, benefits, and alternatives were carefully and thoroughly reviewed with the patient. Ample time for discussion and questions allowed. The patient understood, was satisfied, and agreed to proceed.     HPI: Cheryl Buck is a 45 y.o. female who presents for EGD and colonoscopy.  Medical history as below.  First colonoscopy.  Tolerated the prep.  No recent chest pain, shortness of breath.  No abdominal pain.  Past Medical History:  Diagnosis Date   Abnormal Pap smear    Allergy    Atypical mole 03/29/2012   left 2nd toe tx wider shave    Atypical mole 03/29/2012   mild-mod- right abdoemn (monitor)   Atypical mole 04/06/2013   mod-severe-left abdomen- (EXC)   Atypical mole 11/06/2013   mod-right shoulder (monitor)   Atypical mole 11/06/2013   moderate-left lateral breast (monitor)   Atypical mole 05/15/2014   mod-severe-left inner thigh (WS)   Atypical mole 05/15/2014   mod-right buttock (monitor)   Atypical mole 05/15/2014   moderate- right post knee (monitor)   Atypical mole 05/15/2014   moderate- right inner arm (moinitor)   Atypical mole 12/23/2017   moderate- left outer shoulder (0)   Heart murmur    History of excision of lesion    [place] derm- "atypical margins"   Kidney stones    during pregnancy   Peptic ulcer    PVC (premature ventricular contraction)    Raynaud's disease     Past Surgical History:  Procedure Laterality Date   APPENDECTOMY     GYNECOLOGIC CRYOSURGERY     MASS EXCISION  06/30/2012   Procedure: MINOR EXCISION OF MASS;  Surgeon: Rolm Bookbinder, MD;  Location: [facility];  Service: General;   Laterality: Left;   WISDOM TOOTH EXTRACTION      Prior to Admission medications   Medication Sig Start Date End Date Taking? Authorizing Provider  amphetamine-dextroamphetamine (ADDERALL) 7.5 MG tablet Take 1 tablet by mouth daily as needed. 03/13/19  Yes Kuneff, Renee A, DO  IBUPROFEN PO Take by mouth. PRN   Yes [provider]  meloxicam (MOBIC) 15 MG tablet Take 1 tablet (15 mg total) by mouth daily. Patient taking differently: Take 15 mg by mouth daily. Takes PRN 01/16/21  Yes Hulan Saas M, DO  ondansetron (ZOFRAN) 4 MG tablet Take 1 tablet (4 mg total) by mouth every 8 (eight) hours as needed for nausea or vomiting. Take per colonoscopy prep instructions 06/02/21  Yes Odesser Tourangeau, Lajuan Lines, MD    Current Outpatient Medications  Medication Sig Dispense Refill   amphetamine-dextroamphetamine (ADDERALL) 7.5 MG tablet Take 1 tablet by mouth daily as needed. 45 tablet 0   IBUPROFEN PO Take by mouth. PRN     meloxicam (MOBIC) 15 MG tablet Take 1 tablet (15 mg total) by mouth daily. (Patient taking differently: Take 15 mg by mouth daily. Takes PRN) 30 tablet 0   ondansetron (ZOFRAN) 4 MG tablet Take 1 tablet (4 mg total) by mouth every 8 (eight) hours as needed for nausea or vomiting. Take per colonoscopy prep instructions 2 tablet 0   Current Facility-Administered Medications  Medication Dose Route Frequency Provider Last Rate Last Admin  0.9 %  sodium chloride infusion  500 mL Intravenous Once Sims Laday, Lajuan Lines, MD        Allergies as of 06/09/2021 - Review Complete 06/09/2021  Allergen Reaction Noted   Benadryl [diphenhydramine hcl]  03/04/2011   Coconut fatty acids Swelling 08/08/2011   Pineapple Swelling 08/08/2011   Diphenhydramine Palpitations 03/13/2019    Family History  Problem Relation Age of Onset   Hyperlipidemia Mother    GER disease Mother    Hypertension Father    Asthma Father    Asthma Sister    Diabetes Brother        type1   Mental retardation Brother         half brother down's   Hypertension Brother    Diabetes Maternal Aunt        type2   Heart disease Maternal Grandmother    Diabetes Maternal Grandfather        type2   Heart disease Maternal Grandfather    Colon cancer Paternal Grandmother        possible colon CA, did have a colostomy   Cancer Paternal Grandmother        colon and pituitary tumor   Stroke Paternal Grandfather    Asthma Daughter    Esophageal cancer Neg Hx    Stomach cancer Neg Hx    Rectal cancer Neg Hx     Social History   Socioeconomic History   Marital status: Married    Spouse name: Not on file   Number of children: Not on file   Years of education: Not on file   Highest education level: Not on file  Occupational History   Not on file  Tobacco Use   Smoking status: Never   Smokeless tobacco: Never  Vaping Use   Vaping Use: Never used  Substance and Sexual Activity   Alcohol use: Yes    Alcohol/week: 1.0 standard drink    Types: 1 Glasses of wine per week    Comment: A glass of wine socially    Drug use: No   Sexual activity: Yes  Other Topics Concern   Not on file  Social History Narrative   Not on file   Social Determinants of Health   Financial Resource Strain: Not on file  Food Insecurity: Not on file  Transportation Needs: Not on file  Physical Activity: Not on file  Stress: Not on file  Social Connections: Not on file  Intimate Partner Violence: Not on file    Physical Exam: Vital signs in last 24 hours: @BP  120/74   Pulse 76   Temp 98.2 F (36.8 C)   Ht 5\' 6"  (1.676 m)   Wt 150 lb (68 kg)   LMP 05/26/2021 (Exact Date)   SpO2 100%   BMI 24.21 kg/m  GEN: NAD EYE: Sclerae anicteric ENT: MMM CV: Non-tachycardic Pulm: CTA b/l GI: Soft, NT/ND NEURO:  Alert & Oriented x 3   Zenovia Jarred, MD [facility]  06/09/2021 10:29 AM

## 2021-06-09 NOTE — Patient Instructions (Signed)
YOU HAD AN ENDOSCOPIC PROCEDURE TODAY AT [facility]:   Refer to the procedure report that was given to you for any specific questions about what was found during the examination.  If the procedure report does not answer your questions, please call your gastroenterologist to clarify.  If you requested that your care partner not be given the details of your procedure findings, then the procedure report has been included in a sealed envelope for you to review at your convenience later.  YOU SHOULD EXPECT: Some feelings of bloating in the abdomen. Passage of more gas than usual.  Walking can help get rid of the air that was put into your GI tract during the procedure and reduce the bloating. If you had a lower endoscopy (such as a colonoscopy or flexible sigmoidoscopy) you may notice spotting of blood in your stool or on the toilet paper. If you underwent a bowel prep for your procedure, you may not have a normal bowel movement for a few days.  Please Note:  You might notice some irritation and congestion in your nose or some drainage.  This is from the oxygen used during your procedure.  There is no need for concern and it should clear up in a day or so.  SYMPTOMS TO REPORT IMMEDIATELY:  Following lower endoscopy (colonoscopy or flexible sigmoidoscopy):  Excessive amounts of blood in the stool  Significant tenderness or worsening of abdominal pains  Swelling of the abdomen that is new, acute  Fever of 100F or higher  Following upper endoscopy (EGD)  Vomiting of blood or coffee ground material  New chest pain or pain under the shoulder blades  Painful or persistently difficult swallowing  New shortness of breath  Fever of 100F or higher  Black, tarry-looking stools  For urgent or emergent issues, a gastroenterologist can be reached at any hour by calling [phone]. Do not use MyChart messaging for urgent concerns.    DIET:  We do recommend a small meal at first, but  then you may proceed to your regular diet.  Drink plenty of fluids but you should avoid alcoholic beverages for 24 hours.  ACTIVITY:  You should plan to take it easy for the rest of today and you should NOT DRIVE or use heavy machinery until tomorrow (because of the sedation medicines used during the test).    FOLLOW UP: Our staff will call the number listed on your records 48-72 hours following your procedure to check on you and address any questions or concerns that you may have regarding the information given to you following your procedure. If we do not reach you, we will leave a message.  We will attempt to reach you two times.  During this call, we will ask if you have developed any symptoms of COVID 19. If you develop any symptoms (ie: fever, flu-like symptoms, shortness of breath, cough etc.) before then, please call [phone].  If you test positive for Covid 19 in the 2 weeks post procedure, please call and report this information to [place].    If any biopsies were taken you will be contacted by phone or by letter within the next 1-3 weeks.  Please call us at [phone] if you have not heard about the biopsies in 3 weeks.    SIGNATURES/CONFIDENTIALITY: You and/or your care partner have signed paperwork which will be entered into your electronic medical record.  These signatures attest to the fact that that the information above on your After  Visit Summary has been reviewed and is understood.  Full responsibility of the confidentiality of this discharge information lies with you and/or your care-partner.    AVOID NSAIDS. RESUME REMAINDER OF MEDICATIONS.

## 2021-06-09 NOTE — Progress Notes (Signed)
1038 Robinul 0.1 mg IV given due large amount of secretions upon assessment.  MD made aware, vss

## 2021-06-09 NOTE — Op Note (Signed)
[facility] Patient Name: Cheryl Buck Procedure Date: 06/09/2021 10:32 AM MRN: [id number] Endoscopist: Jerene Bears , MD Age: 44 Referring MD:  Date of Birth: 04-28-1976 Gender: Female Account #: [id number] Procedure:                Upper GI endoscopy Indications:              Heartburn, Nausea, and intermittent dyspeptic                            symptoms Medicines:                Monitored Anesthesia Care Procedure:                Pre-Anesthesia Assessment:                           - Prior to the procedure, a History and Physical                            was performed, and patient medications and                            allergies were reviewed. The patient's tolerance of                            previous anesthesia was also reviewed. The risks                            and benefits of the procedure and the sedation                            options and risks were discussed with the patient.                            All questions were answered, and informed consent                            was obtained. Prior Anticoagulants: The patient has                            taken no previous anticoagulant or antiplatelet                            agents. ASA Grade Assessment: II - A patient with                            mild systemic disease. After reviewing the risks                            and benefits, the patient was deemed in                            satisfactory condition to undergo the procedure.  After obtaining informed consent, the endoscope was                            passed under direct vision. Throughout the                            procedure, the patient's blood pressure, pulse, and                            oxygen saturations were monitored continuously. The                            Endoscope was introduced through the mouth, and                            advanced to the second part of duodenum. The upper                             GI endoscopy was accomplished without difficulty.                            The patient tolerated the procedure well. Scope In: Scope Out: Findings:                 The examined esophagus was normal.                           The cardia and gastric fundus were normal on                            retroflexion.                           One non-bleeding superficial gastric ulcer with                            gastritis and scattered erosions was found in the                            gastric antrum. The lesion was 6 mm in largest                            dimension. Biopsies were taken with a cold forceps                            for histology and Helicobacter pylori testing.                           The examined duodenum was normal. Complications:            No immediate complications. Estimated Blood Loss:     Estimated blood loss was minimal. Estimated blood                            loss: none. Impression:               -  Normal esophagus.                           - Non-bleeding gastric ulcer with gastritis.                            Biopsied.                           - Normal examined duodenum. Recommendation:           - Patient has a contact number available for                            emergencies. The signs and symptoms of potential                            delayed complications were discussed with the                            patient. Return to normal activities tomorrow.                            Written discharge instructions were provided to the                            patient.                           - Resume previous diet.                           - Continue present medications.                           - Avoid NSAIDs.                           - Pantoprazole 40 mg once daily x 8 weeks (take                            approx 30 min before 1st meal of the day).                           - Await pathology results. Jerene Bears, MD 06/09/2021 11:07:48 AM This report has been signed electronically.

## 2021-06-09 NOTE — Progress Notes (Signed)
Pt's states no medical or surgical changes since previsit or office visit.   VS taken by CW 

## 2021-06-09 NOTE — Progress Notes (Signed)
Report given to PACU, vss 

## 2021-06-09 NOTE — Op Note (Signed)
[facility] Patient Name: Cheryl Buck Procedure Date: 06/09/2021 10:32 AM MRN: [id number] Endoscopist: Jerene Bears , MD Age: 45 Referring MD:  Date of Birth: [birth year] Gender: Female Account #: [id number] Procedure:                Colonoscopy Indications:              Screening for colorectal malignant neoplasm, This                            is the patient's first colonoscopy Medicines:                Monitored Anesthesia Care Procedure:                Pre-Anesthesia Assessment:                           - Prior to the procedure, a History and Physical                            was performed, and patient medications and                            allergies were reviewed. The patient's tolerance of                            previous anesthesia was also reviewed. The risks                            and benefits of the procedure and the sedation                            options and risks were discussed with the patient.                            All questions were answered, and informed consent                            was obtained. Prior Anticoagulants: The patient has                            taken no previous anticoagulant or antiplatelet                            agents. ASA Grade Assessment: II - A patient with                            mild systemic disease. After reviewing the risks                            and benefits, the patient was deemed in                            satisfactory condition to undergo the procedure.  After obtaining informed consent, the colonoscope                            was passed under direct vision. Throughout the                            procedure, the patient's blood pressure, pulse, and                            oxygen saturations were monitored continuously. The                            PCF-HQ190L Colonoscope was introduced through the                            anus and advanced to the  terminal ileum. The                            colonoscopy was performed without difficulty. The                            patient tolerated the procedure well. The quality                            of the bowel preparation was excellent. The                            terminal ileum, ileocecal valve, appendiceal                            orifice, and rectum were photographed. Scope In: 10:50:40 AM Scope Out: 11:01:56 AM Scope Withdrawal Time: 0 hours 9 minutes 18 seconds  Total Procedure Duration: 0 hours 11 minutes 16 seconds  Findings:                 The digital rectal exam was normal.                           The terminal ileum appeared normal.                           A single small-mouthed diverticulum was found in                            the cecum.                           The exam was otherwise without abnormality on                            direct and retroflexion views. No polyps seen. Complications:            No immediate complications. Estimated Blood Loss:     Estimated blood loss: none. Impression:               - The examined portion of the ileum  was normal.                           - Single diverticulum in the cecum.                           - The examination was otherwise normal on direct                            and retroflexion views.                           - No specimens collected. Recommendation:           - Patient has a contact number available for                            emergencies. The signs and symptoms of potential                            delayed complications were discussed with the                            patient. Return to normal activities tomorrow.                            Written discharge instructions were provided to the                            patient.                           - Resume previous diet.                           - Continue present medications.                           - Repeat colonoscopy in 10 years  for screening                            purposes. Jerene Bears, MD 06/09/2021 11:11:53 AM This report has been signed electronically.

## 2021-06-09 NOTE — Progress Notes (Signed)
Called to room to assist during endoscopic procedure.  Patient ID and intended procedure confirmed with present staff. Received instructions for my participation in the procedure from the performing physician.  

## 2021-06-11 ENCOUNTER — Telehealth: Payer: Self-pay

## 2021-06-11 NOTE — Telephone Encounter (Signed)
  Follow up Call-  Call back number 06/09/2021  Post procedure Call Back phone  # [phone]  Permission to leave phone message Yes  Some recent data might be hidden     Patient questions:  Do you have a fever, pain , or abdominal swelling? No. Pain Score  0 *  Have you tolerated food without any problems? Yes.    Have you been able to return to your normal activities? Yes.    Do you have any questions about your discharge instructions: Diet   No. Medications  No. Follow up visit  No.  Do you have questions or concerns about your Care? No.  Actions: * If pain score is 4 or above: No action needed, pain <4.  Have you developed a fever since your procedure? no  2.   Have you had an respiratory symptoms (SOB or cough) since your procedure? no  3.   Have you tested positive for COVID 19 since your procedure no  4.   Have you had any family members/close contacts diagnosed with the COVID 19 since your procedure?  no   If yes to any of these questions please route to Joylene John, RN and Joella Prince, RN

## 2021-07-08 ENCOUNTER — Other Ambulatory Visit (HOSPITAL_COMMUNITY): Payer: Self-pay

## 2021-08-21 ENCOUNTER — Ambulatory Visit: Payer: 59 | Admitting: Physician Assistant

## 2021-10-21 ENCOUNTER — Other Ambulatory Visit: Payer: Self-pay

## 2021-10-21 ENCOUNTER — Encounter: Payer: Self-pay | Admitting: Family Medicine

## 2021-10-21 DIAGNOSIS — M25511 Pain in right shoulder: Secondary | ICD-10-CM

## 2021-10-31 ENCOUNTER — Other Ambulatory Visit: Payer: 59

## 2021-10-31 ENCOUNTER — Ambulatory Visit
Admission: RE | Admit: 2021-10-31 | Discharge: 2021-10-31 | Disposition: A | Discharge status: Home / Self Care | Payer: 59 | Source: Ambulatory Visit | Attending: Family Medicine | Admitting: Family Medicine

## 2021-10-31 DIAGNOSIS — M25511 Pain in right shoulder: Secondary | ICD-10-CM

## 2021-11-03 ENCOUNTER — Encounter: Payer: Self-pay | Admitting: Family Medicine

## 2021-11-10 ENCOUNTER — Other Ambulatory Visit (HOSPITAL_COMMUNITY): Payer: Self-pay

## 2021-12-26 ENCOUNTER — Other Ambulatory Visit: Payer: Self-pay | Admitting: Internal Medicine

## 2021-12-26 DIAGNOSIS — L237 Allergic contact dermatitis due to plants, except food: Secondary | ICD-10-CM

## 2021-12-26 MED ORDER — METHYLPREDNISOLONE 4 MG PO TBPK: ORAL_TABLET | ORAL | 1 refills | Status: DC

## 2021-12-26 NOTE — Progress Notes (Signed)
Patient contacted me after having outbreak on neck and left face due to poison oak exposure. Not currently involving eye or airway  Medrol Dosepak

## 2022-03-26 ENCOUNTER — Other Ambulatory Visit (HOSPITAL_COMMUNITY): Payer: Self-pay

## 2022-03-27 ENCOUNTER — Other Ambulatory Visit (HOSPITAL_COMMUNITY): Payer: Self-pay

## 2022-04-21 ENCOUNTER — Other Ambulatory Visit: Payer: 59

## 2022-05-28 ENCOUNTER — Other Ambulatory Visit: Payer: Self-pay | Admitting: Internal Medicine

## 2022-05-28 ENCOUNTER — Other Ambulatory Visit (HOSPITAL_COMMUNITY): Payer: Self-pay

## 2022-05-28 DIAGNOSIS — K219 Gastro-esophageal reflux disease without esophagitis: Secondary | ICD-10-CM

## 2022-05-28 DIAGNOSIS — K2970 Gastritis, unspecified, without bleeding: Secondary | ICD-10-CM

## 2022-05-28 DIAGNOSIS — K253 Acute gastric ulcer without hemorrhage or perforation: Secondary | ICD-10-CM

## 2022-05-28 MED ORDER — PANTOPRAZOLE SODIUM 40 MG PO TBEC
DELAYED_RELEASE_TABLET | ORAL | 2 refills | Status: DC
  Filled 2022-05-28: qty 60, 60d supply, fill #0
  Filled 2022-11-04: qty 30, 30d supply, fill #0
  Filled 2022-11-23: qty 60, 30d supply, fill #0
  Filled 2023-04-14: qty 60, 30d supply, fill #1
  Filled 2023-05-28 (×2): qty 60, 30d supply, fill #2

## 2022-05-28 NOTE — Progress Notes (Signed)
Refill PPI 

## 2022-09-27 IMAGING — MR MR SHOULDER*R* W/O CM
4 of 5 series · 21 of 40 positions shown · non-contrast
Comparison: Radiographs dated January 16, 2021

CLINICAL DATA: Shoulder pain, rotator cuff disorder suspected.
Limited range of motion for a year.

EXAM:
MRI OF THE RIGHT SHOULDER WITHOUT CONTRAST
TECHNIQUE: Multiplanar, multisequence MR imaging of the shoulder was performed.
No intravenous contrast was administered.

[Series 6: T2 fat-sat · axial · right · 3.0mm · 0.47mm/px · z∈[-69,+27]mm · 8 of 27 slices shown (1 of 3)]
[im 1/27]
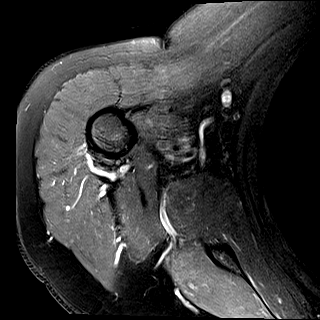
[im 3/27]
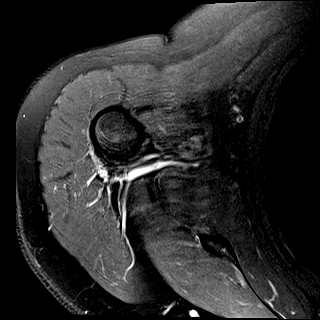
[im 9/27]
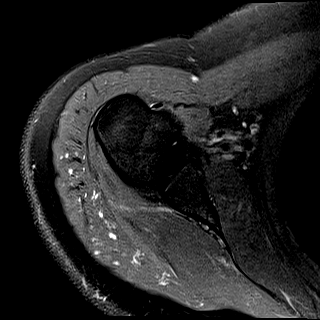
[im 12/27]
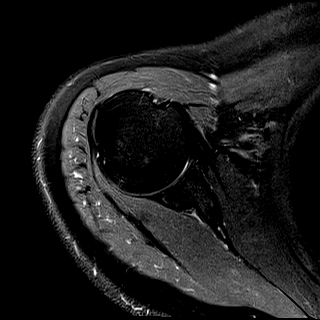
[im 15/27]
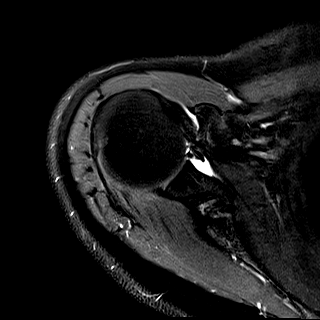
[im 18/27]
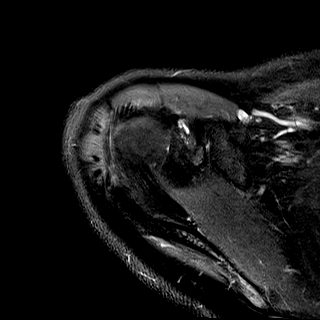
[im 24/27]
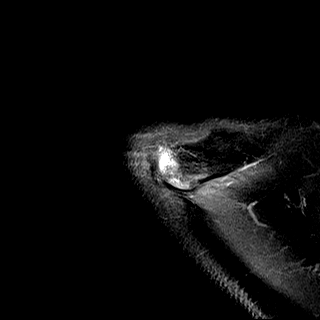
[im 27/27]
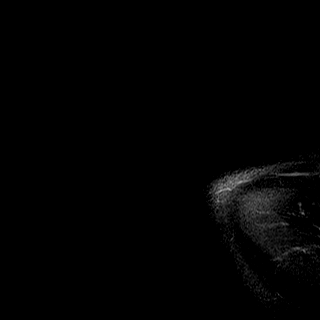

[Series 7: T2 fat-sat · oblique · right · 4.0mm · 0.22mm/px · 3 of 21 slices shown (2 of 3)]
[im 4/21]
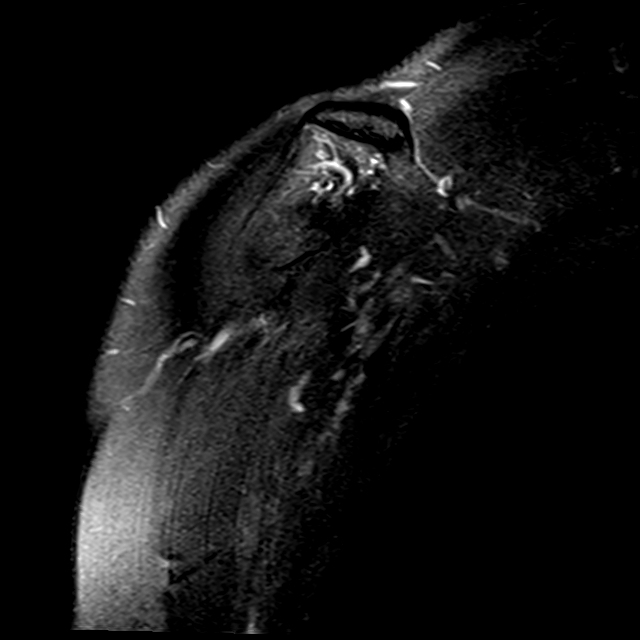
[im 11/21]
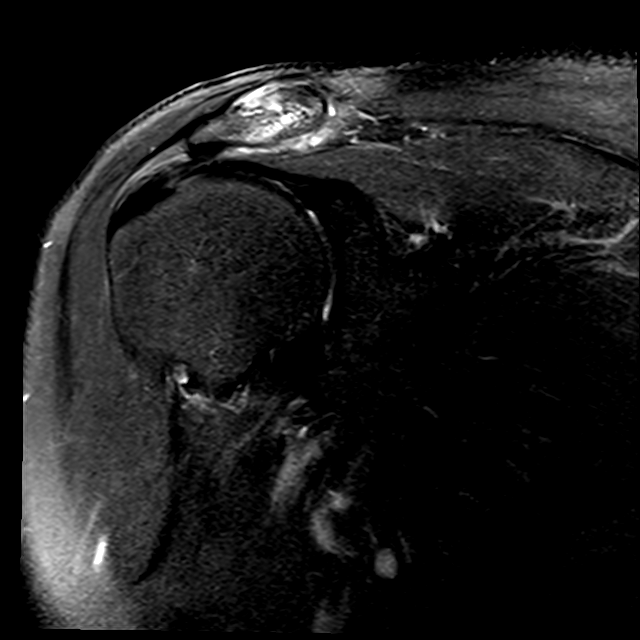
[im 17/21]
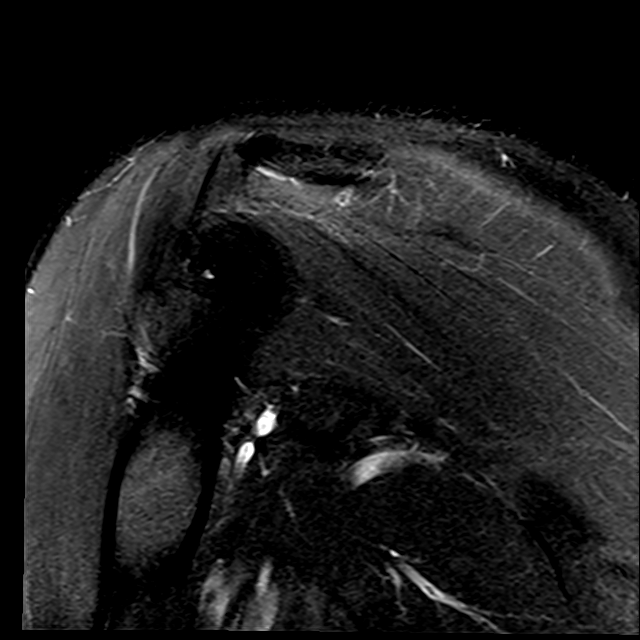

[Series 8: PD · oblique · right · 4.0mm · 0.22mm/px · 7 of 21 slices shown]
[im 1/21]
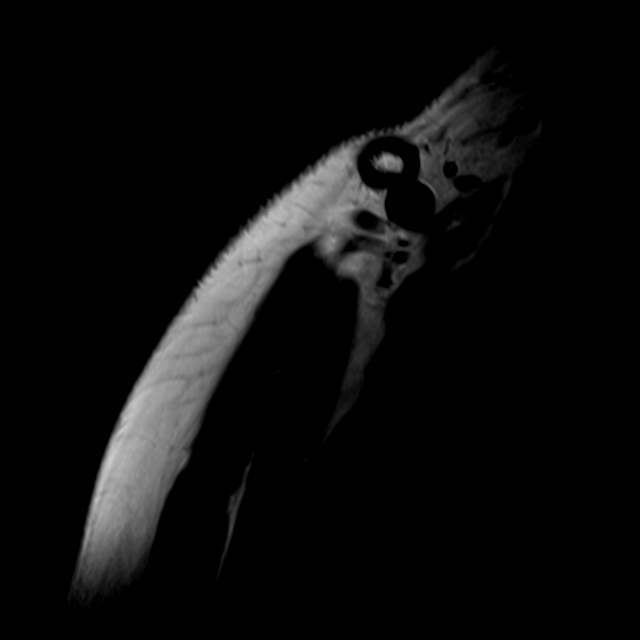
[im 4/21]
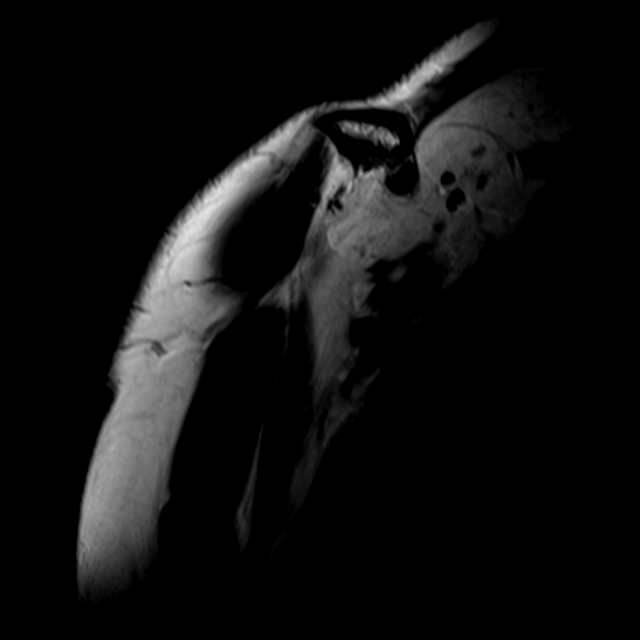
[im 7/21]
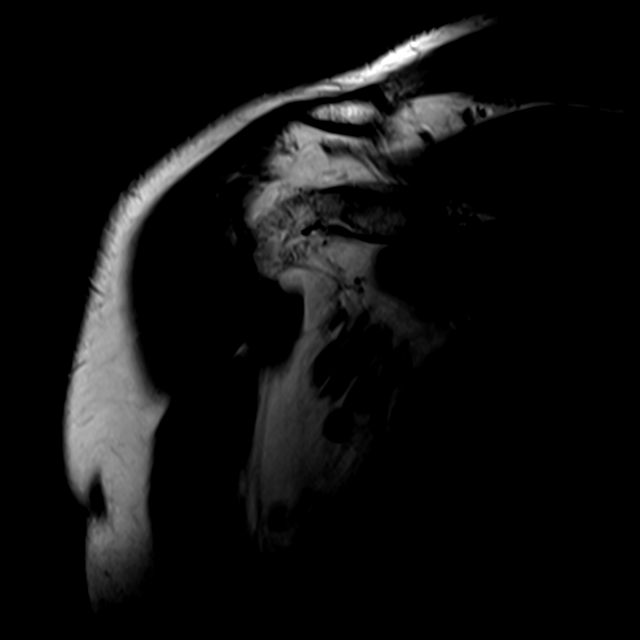
[im 11/21]
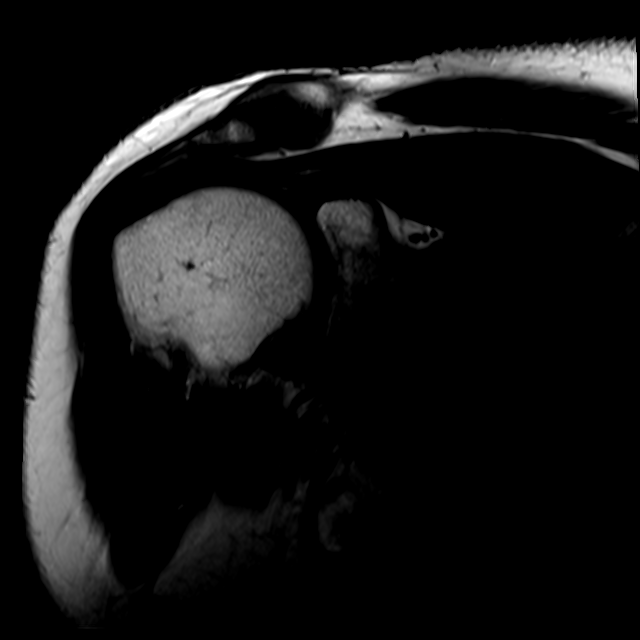
[im 14/21]
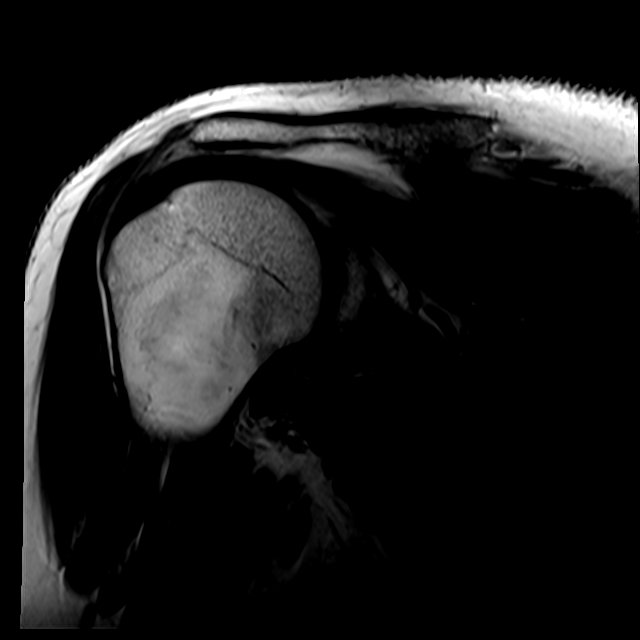
[im 17/21]
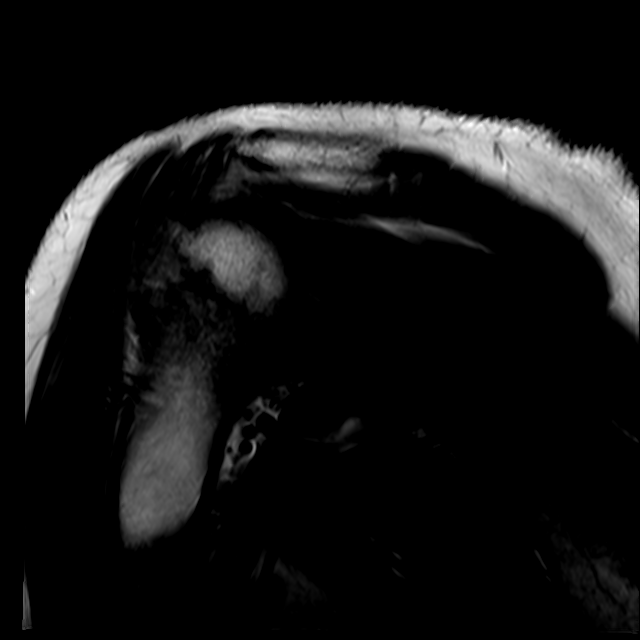
[im 21/21]
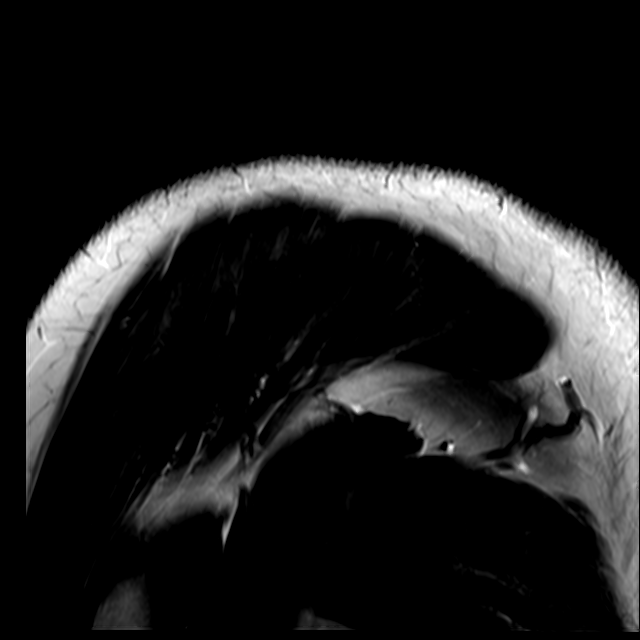

[Series 9: T2 fat-sat · coronal · right · 4.0mm · 0.44mm/px · 3 of 23 slices shown (3 of 3)]
[im 4/23]
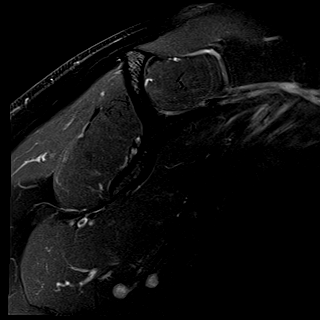
[im 13/23]
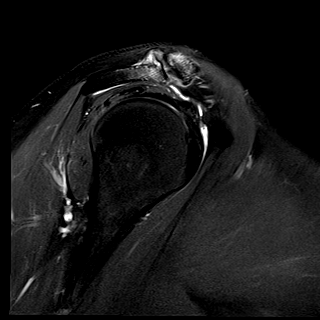
[im 19/23]
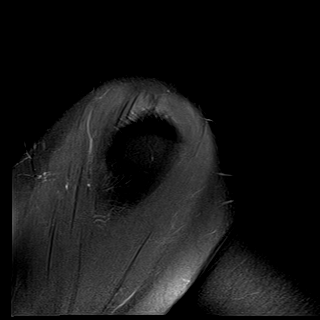

[21 of 40 positions shown; findings below may reference images not displayed]

FINDINGS: Rotator cuff: Supraspinatus tendon is intact. Infraspinatus tendon
is intact. Teres minor tendon is intact. Subscapularis tendon is
intact.

Muscles: No muscle atrophy or edema. No intramuscular fluid
collection or hematoma.

Biceps Long Head: Intraarticular and extraarticular portions of the
biceps tendon are intact.

Acromioclavicular Joint: Moderate arthropathy of the
acromioclavicular joint characterized by joint capsular thickening
subchondral cystic changes and small subacromial osteophytes. No
subacromial/subdeltoid bursal fluid.

Glenohumeral Joint: No joint effusion. No chondral defect.

Labrum: Grossly intact, but evaluation is limited by lack of
intraarticular fluid/contrast.

Bones: No fracture or dislocation. No aggressive osseous lesion.

Other: No fluid collection or hematoma. Mild thickening of the
inferior glenohumeral ligament.
IMPRESSION: 1.  No evidence of rotator cuff tear.

2.  Moderate acromioclavicular osteoarthritis .

3.  No evidence of muscle atrophy.

4. Glenohumeral joint is maintained without evidence of significant
arthropathy.

5. Mild thickening of the inferior glenohumeral ligament, correlate
with symptoms of adhesive capsulitis.

## 2022-11-04 ENCOUNTER — Other Ambulatory Visit (HOSPITAL_COMMUNITY): Payer: Self-pay

## 2022-11-12 ENCOUNTER — Other Ambulatory Visit (HOSPITAL_COMMUNITY): Payer: Self-pay

## 2022-11-23 ENCOUNTER — Other Ambulatory Visit (HOSPITAL_COMMUNITY): Payer: Self-pay

## 2022-12-07 ENCOUNTER — Other Ambulatory Visit: Payer: Self-pay | Admitting: Internal Medicine

## 2022-12-07 ENCOUNTER — Other Ambulatory Visit (HOSPITAL_COMMUNITY): Payer: Self-pay

## 2022-12-07 DIAGNOSIS — R051 Acute cough: Secondary | ICD-10-CM

## 2022-12-07 DIAGNOSIS — J0110 Acute frontal sinusitis, unspecified: Secondary | ICD-10-CM

## 2022-12-07 MED ORDER — AMOXICILLIN-POT CLAVULANATE 875-125 MG PO TABS
1.0000 | ORAL_TABLET | Freq: Two times a day (BID) | ORAL | 0 refills | Status: AC
  Filled 2022-12-07: qty 20, 10d supply, fill #0

## 2022-12-07 NOTE — Progress Notes (Signed)
Acute sinusitis, question bronchitis Augmentin x 7 days

## 2023-05-28 ENCOUNTER — Other Ambulatory Visit (HOSPITAL_COMMUNITY): Payer: Self-pay

## 2023-06-02 ENCOUNTER — Other Ambulatory Visit (HOSPITAL_COMMUNITY): Payer: Self-pay

## 2023-11-22 ENCOUNTER — Other Ambulatory Visit (HOSPITAL_COMMUNITY): Payer: Self-pay

## 2023-11-22 MED ORDER — TRUE VITAMIN D3 1.25 MG (50000 UT) PO CAPS
50000.0000 [IU] | ORAL_CAPSULE | ORAL | 4 refills | Status: AC
  Filled 2023-11-22: qty 12, 84d supply, fill #0
  Filled 2024-02-07: qty 12, 84d supply, fill #1
  Filled 2024-05-01: qty 12, 84d supply, fill #2
  Filled 2024-07-26: qty 12, 84d supply, fill #3

## 2024-02-07 ENCOUNTER — Other Ambulatory Visit (HOSPITAL_COMMUNITY): Payer: Self-pay

## 2024-02-07 ENCOUNTER — Other Ambulatory Visit: Payer: Self-pay | Admitting: Internal Medicine

## 2024-02-07 ENCOUNTER — Other Ambulatory Visit (INDEPENDENT_AMBULATORY_CARE_PROVIDER_SITE_OTHER)

## 2024-02-07 ENCOUNTER — Other Ambulatory Visit (HOSPITAL_BASED_OUTPATIENT_CLINIC_OR_DEPARTMENT_OTHER): Payer: Self-pay

## 2024-02-07 DIAGNOSIS — K2970 Gastritis, unspecified, without bleeding: Secondary | ICD-10-CM

## 2024-02-07 DIAGNOSIS — K219 Gastro-esophageal reflux disease without esophagitis: Secondary | ICD-10-CM

## 2024-02-07 DIAGNOSIS — K2990 Gastroduodenitis, unspecified, without bleeding: Secondary | ICD-10-CM

## 2024-02-07 DIAGNOSIS — E538 Deficiency of other specified B group vitamins: Secondary | ICD-10-CM

## 2024-02-07 DIAGNOSIS — E559 Vitamin D deficiency, unspecified: Secondary | ICD-10-CM

## 2024-02-07 DIAGNOSIS — K253 Acute gastric ulcer without hemorrhage or perforation: Secondary | ICD-10-CM

## 2024-02-07 DIAGNOSIS — E349 Endocrine disorder, unspecified: Secondary | ICD-10-CM

## 2024-02-07 LAB — BASIC METABOLIC PANEL WITH GFR
BUN: 12 mg/dL (ref 6–23)
CO2: 24 meq/L (ref 19–32)
Calcium: 9.4 mg/dL (ref 8.4–10.5)
Chloride: 105 meq/L (ref 96–112)
Creatinine, Ser: 0.79 mg/dL (ref 0.40–1.20)
GFR: 88.31 mL/min (ref >60.00)
Glucose, Bld: 112 mg/dL — ABNORMAL HIGH (ref 70–99)
Potassium: 4.4 meq/L (ref 3.5–5.1)
Sodium: 138 meq/L (ref 135–145)

## 2024-02-07 LAB — VITAMIN B12: Vitamin B-12: 1406 pg/mL — ABNORMAL HIGH (ref 211–911)

## 2024-02-07 LAB — VITAMIN D 25 HYDROXY (VIT D DEFICIENCY, FRACTURES): VITD: 72.42 ng/mL (ref 30.00–100.00)

## 2024-02-07 LAB — TESTOSTERONE: Testosterone: 44.61 ng/dL — ABNORMAL HIGH (ref 15.00–40.00)

## 2024-02-07 MED ORDER — PANTOPRAZOLE SODIUM 40 MG PO TBEC
DELAYED_RELEASE_TABLET | ORAL | 3 refills | Status: AC
  Filled 2024-02-07: qty 30, 30d supply, fill #0
  Filled 2024-05-01: qty 30, 30d supply, fill #1

## 2024-02-07 NOTE — Progress Notes (Signed)
 PPI refill and lab check

## 2024-02-08 ENCOUNTER — Ambulatory Visit: Payer: Self-pay | Admitting: Internal Medicine

## 2024-05-01 ENCOUNTER — Other Ambulatory Visit (HOSPITAL_COMMUNITY): Payer: Self-pay

## 2024-07-26 ENCOUNTER — Other Ambulatory Visit (HOSPITAL_COMMUNITY): Payer: Self-pay

## 2024-07-26 ENCOUNTER — Other Ambulatory Visit: Payer: Self-pay

## 2024-08-01 ENCOUNTER — Other Ambulatory Visit (HOSPITAL_COMMUNITY): Payer: Self-pay
